# Patient Record
Sex: Female | Born: 1955 | Race: White | Hispanic: No | Marital: Married | Smoking: Never smoker
Health system: Southern US, Community
[De-identification: ages and names within clinical notes are randomized; demographics above are authoritative.]

## PROBLEM LIST (undated history)

## (undated) DIAGNOSIS — M8580 Other specified disorders of bone density and structure, unspecified site: Secondary | ICD-10-CM

## (undated) DIAGNOSIS — R928 Other abnormal and inconclusive findings on diagnostic imaging of breast: Secondary | ICD-10-CM

## (undated) DIAGNOSIS — Z8489 Family history of other specified conditions: Secondary | ICD-10-CM

## (undated) DIAGNOSIS — D259 Leiomyoma of uterus, unspecified: Secondary | ICD-10-CM

## (undated) HISTORY — PX: RETINAL DETACHMENT SURGERY: SHX105

## (undated) HISTORY — DX: Other abnormal and inconclusive findings on diagnostic imaging of breast: R92.8

## (undated) HISTORY — DX: Leiomyoma of uterus, unspecified: D25.9

## (undated) HISTORY — PX: COLONOSCOPY: SHX174

## (undated) HISTORY — DX: Other specified disorders of bone density and structure, unspecified site: M85.80

---

## 2005-01-11 ENCOUNTER — Ambulatory Visit: Payer: Self-pay

## 2006-02-06 ENCOUNTER — Ambulatory Visit: Payer: Self-pay

## 2007-02-26 ENCOUNTER — Ambulatory Visit: Payer: Self-pay

## 2008-02-27 ENCOUNTER — Ambulatory Visit: Payer: Self-pay

## 2008-03-16 ENCOUNTER — Ambulatory Visit: Payer: Self-pay | Admitting: General Surgery

## 2008-09-01 ENCOUNTER — Ambulatory Visit: Payer: Self-pay | Admitting: General Surgery

## 2009-03-01 ENCOUNTER — Ambulatory Visit: Payer: Self-pay

## 2010-03-03 ENCOUNTER — Ambulatory Visit: Payer: Self-pay

## 2011-03-24 ENCOUNTER — Ambulatory Visit: Payer: Self-pay

## 2011-04-04 ENCOUNTER — Ambulatory Visit: Payer: Self-pay

## 2016-05-05 ENCOUNTER — Other Ambulatory Visit: Payer: Self-pay | Admitting: Family Medicine

## 2016-05-05 DIAGNOSIS — Z1231 Encounter for screening mammogram for malignant neoplasm of breast: Secondary | ICD-10-CM

## 2016-05-22 ENCOUNTER — Ambulatory Visit
Admission: RE | Admit: 2016-05-22 | Discharge: 2016-05-22 | Disposition: A | Discharge status: Home / Self Care | Payer: BC Managed Care – PPO | Source: Ambulatory Visit | Attending: Family Medicine | Admitting: Family Medicine

## 2016-05-22 ENCOUNTER — Encounter: Payer: Self-pay | Admitting: Radiology

## 2016-05-22 DIAGNOSIS — Z1231 Encounter for screening mammogram for malignant neoplasm of breast: Secondary | ICD-10-CM | POA: Diagnosis present

## 2017-04-02 ENCOUNTER — Other Ambulatory Visit: Payer: Self-pay | Admitting: Obstetrics and Gynecology

## 2017-04-02 DIAGNOSIS — Z1231 Encounter for screening mammogram for malignant neoplasm of breast: Secondary | ICD-10-CM

## 2017-05-07 ENCOUNTER — Encounter (INDEPENDENT_AMBULATORY_CARE_PROVIDER_SITE_OTHER): Payer: BC Managed Care – PPO | Admitting: Ophthalmology

## 2017-05-07 DIAGNOSIS — H2513 Age-related nuclear cataract, bilateral: Secondary | ICD-10-CM

## 2017-05-07 DIAGNOSIS — H43813 Vitreous degeneration, bilateral: Secondary | ICD-10-CM | POA: Diagnosis not present

## 2017-05-07 DIAGNOSIS — H33303 Unspecified retinal break, bilateral: Secondary | ICD-10-CM

## 2017-05-07 DIAGNOSIS — H35413 Lattice degeneration of retina, bilateral: Secondary | ICD-10-CM

## 2017-05-23 ENCOUNTER — Ambulatory Visit
Admission: RE | Admit: 2017-05-23 | Discharge: 2017-05-23 | Disposition: A | Discharge status: Home / Self Care | Payer: BC Managed Care – PPO | Source: Ambulatory Visit | Attending: Obstetrics and Gynecology | Admitting: Obstetrics and Gynecology

## 2017-05-23 DIAGNOSIS — Z1231 Encounter for screening mammogram for malignant neoplasm of breast: Secondary | ICD-10-CM

## 2017-05-30 ENCOUNTER — Encounter: Payer: Self-pay | Admitting: Obstetrics and Gynecology

## 2017-05-30 ENCOUNTER — Ambulatory Visit (INDEPENDENT_AMBULATORY_CARE_PROVIDER_SITE_OTHER): Payer: BC Managed Care – PPO | Admitting: Obstetrics and Gynecology

## 2017-05-30 VITALS — BP 132/88 | Ht 64.0 in | Wt 149.0 lb

## 2017-05-30 DIAGNOSIS — Z01419 Encounter for gynecological examination (general) (routine) without abnormal findings: Secondary | ICD-10-CM

## 2017-05-30 DIAGNOSIS — Z1331 Encounter for screening for depression: Secondary | ICD-10-CM | POA: Diagnosis not present

## 2017-05-30 DIAGNOSIS — N941 Unspecified dyspareunia: Secondary | ICD-10-CM | POA: Insufficient documentation

## 2017-05-30 DIAGNOSIS — Z1339 Encounter for screening examination for other mental health and behavioral disorders: Secondary | ICD-10-CM

## 2017-05-30 MED ORDER — ESTROGENS, CONJUGATED 0.625 MG/GM VA CREA: 1.0000 | TOPICAL_CREAM | VAGINAL | 3 refills | Status: DC

## 2017-05-30 NOTE — Progress Notes (Signed)
Routine Annual Gynecology Examination   PCP: Patient, No Pcp Per  Chief Complaint  Patient presents with  . Annual Exam   History of Present Illness: Patient is a 61 y.o. G2P0020 presents for annual exam. The patient has no complaints today.   Menopausal bleeding: denies  Menopausal symptoms: denies  Breast symptoms: denies  Last pap smear: 1 years ago.  Result Normal  Last mammogram: 1 week ago.  Result Normal  ([facility] 1)  Stopped using Premarin last year due to husband having having oral surgery.  Wants to re-start.   Past Medical History:  Diagnosis Date  . Abnormal mammogram   . Leiomyoma of uterus, unspecified   . Osteopenia     Past Surgical History:  Procedure Laterality Date  . COLONOSCOPY    . RETINAL DETACHMENT SURGERY      Prior to Admission medications   Medication Sig Start Date End Date Taking? Authorizing Provider  Calcium Carbonate-Vit D-Min (CALCIUM 1200 PO) Take by mouth.   Yes [provider]  cholecalciferol (VITAMIN D) 1000 units tablet Take 1,000 Units by mouth daily.   Yes [provider]   Allergies: No Known Allergies  Obstetric History: [id number], SAB x 2  Social History   Social History  . Marital status: Married    Spouse name: N/A  . Number of children: N/A  . Years of education: N/A   Occupational History  . Not on file.   Social History Main Topics  . Smoking status: Never Smoker  . Smokeless tobacco: Never Used  . Alcohol use No  . Drug use: No  . Sexual activity: Not Currently    Birth control/ protection: Post-menopausal   Other Topics Concern  . Not on file   Social History Narrative  . No narrative on file    Family History  Problem Relation Age of Onset  . Breast cancer Maternal Grandmother   . Hypertension Mother   . Stroke Paternal Grandfather     Review of Systems  Constitutional: Negative.   HENT: Negative.   Eyes: Negative.   Respiratory: Negative.   Cardiovascular:  Negative.   Gastrointestinal: Negative.   Genitourinary: Negative.   Musculoskeletal: Negative.   Skin: Negative.   Neurological: Negative.   Psychiatric/Behavioral: Negative.      Physical Exam Vitals: BP 132/88   Ht 5\' 4"  (1.626 m)   Wt 149 lb (67.6 kg)   BMI 25.58 kg/m   Physical Exam  Constitutional: She is oriented to person, place, and time. She appears well-developed and well-nourished. No distress.  Genitourinary: Vagina normal and uterus normal. Pelvic exam was performed with patient supine. There is no rash, tenderness or lesion on the right labia. There is no rash or lesion on the left labia. No bleeding in the vagina. Right adnexum does not display mass, does not display tenderness and does not display fullness. Left adnexum does not display mass, does not display tenderness and does not display fullness. Cervix does not exhibit motion tenderness, lesion or polyp.   Uterus is mobile. Uterus is not enlarged, tender or exhibiting a mass.  HENT:  Head: Normocephalic and atraumatic.  Eyes: EOM are normal. No scleral icterus.  Neck: Normal range of motion. Neck supple. No thyromegaly present.  Cardiovascular: Normal rate and regular rhythm.  Exam reveals no gallop and no friction rub.   No murmur heard. Pulmonary/Chest: Effort normal and breath sounds normal. No respiratory distress. She has no wheezes. She has no rales.  Abdominal: Soft.  Bowel sounds are normal. She exhibits no distension and no mass. There is no tenderness. There is no rebound and no guarding.  Musculoskeletal: Normal range of motion. She exhibits no edema.  Lymphadenopathy:    She has no cervical adenopathy.  Neurological: She is alert and oriented to person, place, and time. No cranial nerve deficit.  Skin: Skin is warm and dry. No erythema.  Psychiatric: She has a normal mood and affect. Her behavior is normal. Judgment normal.    Female chaperone present for pelvic and breast  portions of the physical  exam  Results: AUDIT Questionnaire (screen for alcoholism): 1 PHQ-9: 1   Assessment and Plan:  61 y.o. G35P0020 female here for routine annual gynecologic examination  Plan: Problem List Items Addressed This Visit    Dyspareunia, female   Relevant Medications   conjugated estrogens (PREMARIN) vaginal cream (Start on 05/31/2017)    Other Visit Diagnoses    Women's annual routine gynecological examination    -  Primary   Screening for depression       Screening for alcohol problem          Screening: -- Blood pressure screen normal -- Colonoscopy - due - managed by PCP -- Mammogram - not due (just completed 1 week ago) -- Weight screening: normal -- Depression screening negative (PHQ-9) -- Nutrition: normal -- cholesterol screening: per PCP -- osteoporosis screening: not due -- tobacco screening: not using -- alcohol screening: AUDIT questionnaire indicates low-risk usage. -- family history of breast cancer screening: done. not at high risk. -- no evidence of domestic violence or intimate partner violence. -- STD screening: gonorrhea/chlamydia NAAT not collected per patient request. -- pap smear not collected per ASCCP guidelines -- HPV vaccination series: not eligilbe -- Other vaccines: shingles - per PCP  Prentice Docker, MD 05/30/2017 12:40 PM

## 2017-05-31 ENCOUNTER — Encounter (INDEPENDENT_AMBULATORY_CARE_PROVIDER_SITE_OTHER): Payer: BC Managed Care – PPO | Admitting: Ophthalmology

## 2017-05-31 DIAGNOSIS — H33302 Unspecified retinal break, left eye: Secondary | ICD-10-CM

## 2017-06-18 ENCOUNTER — Encounter (INDEPENDENT_AMBULATORY_CARE_PROVIDER_SITE_OTHER): Payer: BC Managed Care – PPO | Admitting: Ophthalmology

## 2017-06-18 DIAGNOSIS — H33303 Unspecified retinal break, bilateral: Secondary | ICD-10-CM

## 2017-10-17 ENCOUNTER — Encounter (INDEPENDENT_AMBULATORY_CARE_PROVIDER_SITE_OTHER): Payer: BC Managed Care – PPO | Admitting: Ophthalmology

## 2017-10-17 DIAGNOSIS — H43813 Vitreous degeneration, bilateral: Secondary | ICD-10-CM

## 2017-10-17 DIAGNOSIS — H33303 Unspecified retinal break, bilateral: Secondary | ICD-10-CM

## 2017-10-17 DIAGNOSIS — H2513 Age-related nuclear cataract, bilateral: Secondary | ICD-10-CM | POA: Diagnosis not present

## 2018-04-19 ENCOUNTER — Other Ambulatory Visit: Payer: Self-pay | Admitting: Obstetrics and Gynecology

## 2018-04-19 DIAGNOSIS — Z1231 Encounter for screening mammogram for malignant neoplasm of breast: Secondary | ICD-10-CM

## 2018-05-27 ENCOUNTER — Ambulatory Visit
Admission: RE | Admit: 2018-05-27 | Discharge: 2018-05-27 | Disposition: A | Discharge status: Home / Self Care | Payer: BC Managed Care – PPO | Source: Ambulatory Visit | Attending: Obstetrics and Gynecology | Admitting: Obstetrics and Gynecology

## 2018-05-27 DIAGNOSIS — Z1231 Encounter for screening mammogram for malignant neoplasm of breast: Secondary | ICD-10-CM | POA: Diagnosis not present

## 2018-06-03 ENCOUNTER — Other Ambulatory Visit (HOSPITAL_COMMUNITY)
Admission: RE | Admit: 2018-06-03 | Discharge: 2018-06-03 | Disposition: A | Discharge status: Home / Self Care | Payer: BC Managed Care – PPO | Source: Ambulatory Visit | Attending: Obstetrics and Gynecology | Admitting: Obstetrics and Gynecology

## 2018-06-03 ENCOUNTER — Encounter: Payer: Self-pay | Admitting: Obstetrics and Gynecology

## 2018-06-03 ENCOUNTER — Ambulatory Visit (INDEPENDENT_AMBULATORY_CARE_PROVIDER_SITE_OTHER): Payer: BC Managed Care – PPO | Admitting: Obstetrics and Gynecology

## 2018-06-03 VITALS — BP 118/74 | Ht 64.0 in | Wt 145.0 lb

## 2018-06-03 DIAGNOSIS — Z01419 Encounter for gynecological examination (general) (routine) without abnormal findings: Secondary | ICD-10-CM | POA: Diagnosis not present

## 2018-06-03 DIAGNOSIS — Z1211 Encounter for screening for malignant neoplasm of colon: Secondary | ICD-10-CM | POA: Diagnosis not present

## 2018-06-03 DIAGNOSIS — Z124 Encounter for screening for malignant neoplasm of cervix: Secondary | ICD-10-CM

## 2018-06-03 DIAGNOSIS — Z1339 Encounter for screening examination for other mental health and behavioral disorders: Secondary | ICD-10-CM | POA: Diagnosis not present

## 2018-06-03 DIAGNOSIS — Z1331 Encounter for screening for depression: Secondary | ICD-10-CM | POA: Diagnosis not present

## 2018-06-03 NOTE — Progress Notes (Signed)
Routine Annual Gynecology Examination   PCP: Lawerance Cruel, MD  Chief Complaint  Patient presents with  . Annual Exam    History of Present Illness: Patient is a 62 y.o. G2P0020 presents for annual exam. The patient has no complaints today.   Menopausal bleeding: denies  Menopausal symptoms: mild hot flashes  Breast symptoms: denies  Last pap smear: 2 years ago.  Result Normal  Last mammogram: this month.  Result Normal  Last Colonoscopy: 10 years ago.  Was normal.   Past Medical History:  Diagnosis Date  . Abnormal mammogram   . Leiomyoma of uterus, unspecified   . Osteopenia     Past Surgical History:  Procedure Laterality Date  . COLONOSCOPY    . RETINAL DETACHMENT SURGERY      Prior to Admission medications   Medication Sig Start Date End Date Taking? Authorizing Provider  Calcium Carbonate-Vit D-Min (CALCIUM 1200 PO) Take by mouth.   Yes [provider]  cholecalciferol (VITAMIN D) 1000 units tablet Take 1,000 Units by mouth daily.   Yes [provider]  conjugated estrogens (PREMARIN) vaginal cream Place 1 Applicatorful vaginally 2 (two) times a week. 0.5 gram vaginally at bedtime twice weekly Patient not taking: Reported on 06/03/2018 05/31/17   Will Bonnet, MD   Allergies: No Known Allergies  Obstetric History: G2P0020  Social History   Socioeconomic History  . Marital status: Married    Spouse name: Not on file  . Number of children: Not on file  . Years of education: Not on file  . Highest education level: Not on file  Occupational History  . Not on file  Social Needs  . Financial resource strain: Not on file  . Food insecurity:    Worry: Not on file    Inability: Not on file  . Transportation needs:    Medical: Not on file    Non-medical: Not on file  Tobacco Use  . Smoking status: Never Smoker  . Smokeless tobacco: Never Used  Substance and Sexual Activity  . Alcohol use: No  . Drug use: No  . Sexual  activity: Not Currently    Birth control/protection: Post-menopausal  Lifestyle  . Physical activity:    Days per week: Not on file    Minutes per session: Not on file  . Stress: Not on file  Relationships  . Social connections:    Talks on phone: Not on file    Gets together: Not on file    Attends religious service: Not on file    Active member of club or organization: Not on file    Attends meetings of clubs or organizations: Not on file    Relationship status: Not on file  . Intimate partner violence:    Fear of current or ex partner: Not on file    Emotionally abused: Not on file    Physically abused: Not on file    Forced sexual activity: Not on file  Other Topics Concern  . Not on file  Social History Narrative  . Not on file    Family History  Problem Relation Age of Onset  . Breast cancer Maternal Grandmother   . Hypertension Mother   . Stroke Paternal Grandfather     Review of Systems  Constitutional: Negative.   HENT: Negative.   Eyes: Negative.   Respiratory: Negative.   Cardiovascular: Negative.   Gastrointestinal: Negative.   Genitourinary: Negative.   Musculoskeletal: Negative.   Skin: Negative.  Neurological: Negative.   Psychiatric/Behavioral: Negative.      Physical Exam Vitals: BP 118/74   Ht 5\' 4"  (1.626 m)   Wt 145 lb (65.8 kg)   BMI 24.89 kg/m   Physical Exam  Constitutional: She is oriented to person, place, and time. She appears well-developed and well-nourished. No distress.  Genitourinary: Uterus normal. Pelvic exam was performed with patient supine. There is no rash, tenderness, lesion or injury on the right labia. There is no rash, tenderness, lesion or injury on the left labia. No erythema, tenderness or bleeding in the vagina. No signs of injury around the vagina. No vaginal discharge found. Right adnexum does not display mass, does not display tenderness and does not display fullness. Left adnexum does not display mass, does not  display tenderness and does not display fullness. Cervix does not exhibit motion tenderness, lesion, discharge or polyp.   Uterus is mobile and anteverted. Uterus is not enlarged, tender or exhibiting a mass.  HENT:  Head: Normocephalic and atraumatic.  Eyes: EOM are normal. No scleral icterus.  Neck: Normal range of motion. Neck supple. No thyromegaly present.  Cardiovascular: Normal rate and regular rhythm. Exam reveals no gallop and no friction rub.  No murmur heard. Pulmonary/Chest: Effort normal and breath sounds normal. No respiratory distress. She has no wheezes. She has no rales. Right breast exhibits no inverted nipple, no mass, no nipple discharge, no skin change and no tenderness. Left breast exhibits no inverted nipple, no mass, no nipple discharge, no skin change and no tenderness.  Abdominal: Soft. Bowel sounds are normal. She exhibits no distension and no mass. There is no tenderness. There is no rebound and no guarding.  Musculoskeletal: Normal range of motion. She exhibits no edema or tenderness.  Lymphadenopathy:    She has no cervical adenopathy.       Right: No inguinal adenopathy present.       Left: No inguinal adenopathy present.  Neurological: She is alert and oriented to person, place, and time. No cranial nerve deficit.  Skin: Skin is warm and dry. No rash noted. No erythema.  Psychiatric: She has a normal mood and affect. Her behavior is normal. Judgment normal.     Female chaperone present for pelvic and breast  portions of the physical exam  Results: AUDIT Questionnaire (screen for alcoholism): 1 PHQ-9: 1   Assessment and Plan:  62 y.o. G64P0020 female here for routine annual gynecologic examination  Plan: Problem List Items Addressed This Visit    None    Visit Diagnoses    Women's annual routine gynecological examination    -  Primary   Relevant Orders   Cytology - PAP   Screening for depression       Screening for alcoholism       Pap smear for  cervical cancer screening       Relevant Orders   Cytology - PAP      Screening: -- Blood pressure screen normal -- Colonoscopy - due - will schedule -- Mammogram - not due -- Weight screening: normal -- Depression screening negative (PHQ-9) -- Nutrition: normal -- cholesterol screening: per PCP -- osteoporosis screening: not due -- tobacco screening: not using -- alcohol screening: AUDIT questionnaire indicates low-risk usage. -- family history of breast cancer screening: done. not at high risk. -- no evidence of domestic violence or intimate partner violence. -- STD screening: gonorrhea/chlamydia NAAT not collected per patient request. -- pap smear collected per ASCCP guidelines -- HPV vaccination series:  not Lanetta Inch, MD 06/03/2018 10:36 AM

## 2018-06-05 ENCOUNTER — Encounter: Payer: Self-pay | Admitting: Obstetrics and Gynecology

## 2018-06-05 LAB — CYTOLOGY - PAP
Diagnosis: NEGATIVE
HPV: NOT DETECTED

## 2018-06-27 ENCOUNTER — Ambulatory Visit: Payer: BC Managed Care – PPO | Admitting: General Surgery

## 2018-06-27 ENCOUNTER — Other Ambulatory Visit: Payer: Self-pay

## 2018-06-27 ENCOUNTER — Encounter: Payer: Self-pay | Admitting: General Surgery

## 2018-06-27 VITALS — BP 122/72 | HR 70 | Temp 97.7°F | Resp 12 | Ht 65.0 in | Wt 146.0 lb

## 2018-06-27 DIAGNOSIS — Z1211 Encounter for screening for malignant neoplasm of colon: Secondary | ICD-10-CM

## 2018-06-27 MED ORDER — POLYETHYLENE GLYCOL 3350 17 GM/SCOOP PO POWD: ORAL | 0 refills | Status: DC

## 2018-06-27 NOTE — Patient Instructions (Signed)
Colonoscopy, Adult A colonoscopy is an exam to look at the entire large intestine. During the exam, a lubricated, bendable tube is inserted into the anus and then passed into the rectum, colon, and other parts of the large intestine. A colonoscopy is often done as a part of normal colorectal screening or in response to certain symptoms, such as anemia, persistent diarrhea, abdominal pain, and blood in the stool. The exam can help screen for and diagnose medical problems, including:  Tumors.  Polyps.  Inflammation.  Areas of bleeding.  Tell a health care provider about:  Any allergies you have.  All medicines you are taking, including vitamins, herbs, eye drops, creams, and over-the-counter medicines.  Any problems you or family members have had with anesthetic medicines.  Any blood disorders you have.  Any surgeries you have had.  Any medical conditions you have.  Any problems you have had passing stool. What are the risks? Generally, this is a safe procedure. However, problems may occur, including:  Bleeding.  A tear in the intestine.  A reaction to medicines given during the exam.  Infection (rare).  What happens before the procedure? Eating and drinking restrictions Follow instructions from your health care provider about eating and drinking, which may include:  A few days before the procedure - follow a low-fiber diet. Avoid nuts, seeds, dried fruit, raw fruits, and vegetables.  1-3 days before the procedure - follow a clear liquid diet. Drink only clear liquids, such as clear broth or bouillon, black coffee or tea, clear juice, clear soft drinks or sports drinks, gelatin dessert, and popsicles. Avoid any liquids that contain red or purple dye.  On the day of the procedure - do not eat or drink anything during the 2 hours before the procedure, or within the time period that your health care provider recommends.  Bowel prep If you were prescribed an oral bowel prep  to clean out your colon:  Take it as told by your health care provider. Starting the day before your procedure, you will need to drink a large amount of medicated liquid. The liquid will cause you to have multiple loose stools until your stool is almost clear or light green.  If your skin or anus gets irritated from diarrhea, you may use these to relieve the irritation: ? Medicated wipes, such as adult wet wipes with aloe and vitamin E. ? A skin soothing-product like petroleum jelly.  If you vomit while drinking the bowel prep, take a break for up to 60 minutes and then begin the bowel prep again. If vomiting continues and you cannot take the bowel prep without vomiting, call your health care provider.  General instructions  Ask your health care provider about changing or stopping your regular medicines. This is especially important if you are taking diabetes medicines or blood thinners.  Plan to have someone take you home from the hospital or clinic. What happens during the procedure?  An IV tube may be inserted into one of your veins.  You will be given medicine to help you relax (sedative).  To reduce your risk of infection: ? Your health care team will wash or sanitize their hands. ? Your anal area will be washed with soap.  You will be asked to lie on your side with your knees bent.  Your health care provider will lubricate a long, thin, flexible tube. The tube will have a camera and a light on the end.  The tube will be inserted into your   anus.  The tube will be gently eased through your rectum and colon.  Air will be delivered into your colon to keep it open. You may feel some pressure or cramping.  The camera will be used to take images during the procedure.  A small tissue sample may be removed from your body to be examined under a microscope (biopsy). If any potential problems are found, the tissue will be sent to a lab for testing.  If small polyps are found, your  health care provider may remove them and have them checked for cancer cells.  The tube that was inserted into your anus will be slowly removed. The procedure may vary among health care providers and hospitals. What happens after the procedure?  Your blood pressure, heart rate, breathing rate, and blood oxygen level will be monitored until the medicines you were given have worn off.  Do not drive for 24 hours after the exam.  You may have a small amount of blood in your stool.  You may pass gas and have mild abdominal cramping or bloating due to the air that was used to inflate your colon during the exam.  It is up to you to get the results of your procedure. Ask your health care provider, or the department performing the procedure, when your results will be ready. This information is not intended to replace advice given to you by your health care provider. Make sure you discuss any questions you have with your health care provider. Document Released: 07/28/2000 Document Revised: 05/31/2016 Document Reviewed: 10/12/2015 Elsevier Interactive Patient Education  2018 [employer]  

## 2018-06-27 NOTE — Progress Notes (Signed)
Patient ID: Amber Mendoza, female   DOB: 1956-06-14, 62 y.o.   MRN: [id number]  Chief Complaint  Patient presents with  . Colonoscopy    HPI Amber Mendoza is a 62 y.o. female Here today for evaluation of a screening colonoscopy. Denies any gastrointestinal issues at this time.  Bowels move regular and no bleeding.Last colonoscopy 2010.  HPI  Past Medical History:  Diagnosis Date  . Abnormal mammogram   . Leiomyoma of uterus, unspecified   . Osteopenia     Past Surgical History:  Procedure Laterality Date  . COLONOSCOPY    . RETINAL DETACHMENT SURGERY Bilateral 2015,2017    Family History  Problem Relation Age of Onset  . Breast cancer Maternal Grandmother   . Hypertension Mother   . Stroke Paternal Grandfather     Social History Social History   Tobacco Use  . Smoking status: Never Smoker  . Smokeless tobacco: Never Used  Substance Use Topics  . Alcohol use: No  . Drug use: No    No Known Allergies  Current Outpatient Medications  Medication Sig Dispense Refill  . Calcium Carbonate-Vit D-Min (CALCIUM 1200 PO) Take by mouth.    . cholecalciferol (VITAMIN D) 1000 units tablet Take 1,000 Units by mouth daily.    . polyethylene glycol powder (GLYCOLAX/MIRALAX) powder 255 grams one bottle for colonoscopy prep 255 g 0   No current facility-administered medications for this visit.     Review of Systems Review of Systems  Constitutional: Negative.   Respiratory: Negative.   Cardiovascular: Negative.     Blood pressure 122/72, pulse 70, temperature 97.7 F (36.5 C), temperature source Skin, resp. rate 12, height 5\' 5"  (1.651 m), weight 146 lb (66.2 kg).  Physical Exam Physical Exam  Constitutional: She is oriented to person, place, and time. She appears well-developed and well-nourished.  Eyes: Conjunctivae are normal. No scleral icterus.  Neck: Normal range of motion.  Cardiovascular: Normal rate, regular rhythm and normal heart sounds.  Pulmonary/Chest:  Effort normal and breath sounds normal.  Lymphadenopathy:    She has no cervical adenopathy.  Neurological: She is alert and oriented to person, place, and time.  Skin: Skin is warm and dry.    Data Reviewed September 01, 2008 screening colonoscopy was reviewed.  Normal exam.  Assessment    Candidate for screening colonoscopy.    Plan   Colonoscopy with possible biopsy/polypectomy prn: Information regarding the procedure, including its potential risks and complications (including but not limited to perforation of the bowel, which may require emergency surgery to repair, and bleeding) was verbally given to the patient. Educational information regarding lower intestinal endoscopy was given to the patient. Written instructions for how to complete the bowel prep using Miralax were provided. The importance of drinking ample fluids to avoid dehydration as a result of the prep emphasized.   HPI, Physical Exam, Assessment and Plan have been scribed under the direction and in the presence of Amber Ard, MD.  Amber Mendoza, CMA  I have completed the exam and reviewed the above documentation for accuracy and completeness.  I agree with the above.  Haematologist has been used and any errors in dictation or transcription are unintentional.  Amber Mendoza, M.D., F.A.C.S.  Amber Mendoza 06/27/2018, 3:16 PM  Patient has been scheduled for a colonoscopy on 07-17-18 at [facility]. Miralax prescription has been sent in to the patient's pharmacy today. Colonoscopy instructions have been reviewed with the patient. This patient is aware to call the office if they  have further questions.   Dominga Ferry, CMA

## 2018-07-17 ENCOUNTER — Ambulatory Visit
Admission: RE | Admit: 2018-07-17 | Discharge: 2018-07-17 | Disposition: A | Discharge status: Home / Self Care | Payer: BC Managed Care – PPO | Source: Ambulatory Visit | Attending: General Surgery | Admitting: General Surgery

## 2018-07-17 ENCOUNTER — Ambulatory Visit: Payer: BC Managed Care – PPO | Admitting: Anesthesiology

## 2018-07-17 ENCOUNTER — Encounter
Admission: RE | Disposition: A | Discharge status: Home / Self Care | Payer: Self-pay | Source: Ambulatory Visit | Attending: General Surgery

## 2018-07-17 ENCOUNTER — Other Ambulatory Visit: Payer: Self-pay

## 2018-07-17 DIAGNOSIS — Z79899 Other long term (current) drug therapy: Secondary | ICD-10-CM | POA: Diagnosis not present

## 2018-07-17 DIAGNOSIS — Z1211 Encounter for screening for malignant neoplasm of colon: Secondary | ICD-10-CM | POA: Insufficient documentation

## 2018-07-17 HISTORY — PX: COLONOSCOPY WITH PROPOFOL: SHX5780

## 2018-07-17 HISTORY — DX: Family history of other specified conditions: Z84.89

## 2018-07-17 SURGERY — COLONOSCOPY WITH PROPOFOL
Anesthesia: General

## 2018-07-17 MED ORDER — PROPOFOL 500 MG/50ML IV EMUL
INTRAVENOUS | Status: AC
  Filled 2018-07-17: qty 50

## 2018-07-17 MED ORDER — FENTANYL CITRATE (PF) 100 MCG/2ML IJ SOLN
INTRAMUSCULAR | Status: DC | PRN
  Administered 2018-07-17 (×2): 50 ug via INTRAVENOUS

## 2018-07-17 MED ORDER — PROPOFOL 500 MG/50ML IV EMUL
INTRAVENOUS | Status: DC | PRN
  Administered 2018-07-17: 160 ug/kg/min via INTRAVENOUS

## 2018-07-17 MED ORDER — PROPOFOL 10 MG/ML IV BOLUS
INTRAVENOUS | Status: DC | PRN
  Administered 2018-07-17: 100 mg via INTRAVENOUS

## 2018-07-17 MED ORDER — FENTANYL CITRATE (PF) 100 MCG/2ML IJ SOLN
INTRAMUSCULAR | Status: AC
  Filled 2018-07-17: qty 2

## 2018-07-17 MED ORDER — SODIUM CHLORIDE 0.9 % IV SOLN
INTRAVENOUS | Status: DC
  Administered 2018-07-17: 10:00:00 via INTRAVENOUS

## 2018-07-17 MED ORDER — PHENYLEPHRINE HCL 10 MG/ML IJ SOLN
INTRAMUSCULAR | Status: DC | PRN
  Administered 2018-07-17 (×2): 100 ug via INTRAVENOUS

## 2018-07-17 MED ORDER — LIDOCAINE 2% (20 MG/ML) 5 ML SYRINGE
INTRAMUSCULAR | Status: DC | PRN
  Administered 2018-07-17: 30 mg via INTRAVENOUS

## 2018-07-17 MED ORDER — PROPOFOL 10 MG/ML IV BOLUS
INTRAVENOUS | Status: AC
  Filled 2018-07-17: qty 20

## 2018-07-17 NOTE — Anesthesia Post-op Follow-up Note (Signed)
Anesthesia QCDR form completed.        

## 2018-07-17 NOTE — Anesthesia Preprocedure Evaluation (Addendum)
Anesthesia Evaluation  Patient identified by MRN, date of birth, ID band Patient awake    Reviewed: Allergy & Precautions, H&P , NPO status , reviewed documented beta blocker date and time   History of Anesthesia Complications (+) Family history of anesthesia reaction  Airway Mallampati: II  TM Distance: >3 FB Neck ROM: full    Dental  (+) Teeth Intact   Pulmonary    Pulmonary exam normal        Cardiovascular Normal cardiovascular exam     Neuro/Psych    GI/Hepatic   Endo/Other    Renal/GU      Musculoskeletal   Abdominal   Peds  Hematology   Anesthesia Other Findings Past Medical History: No date: Abnormal mammogram No date: Leiomyoma of uterus, unspecified No date: Osteopenia  Past Surgical History: No date: COLONOSCOPY 2015,2017: RETINAL DETACHMENT SURGERY; Bilateral     Reproductive/Obstetrics                            Anesthesia Physical Anesthesia Plan  ASA: II  Anesthesia Plan: General   Post-op Pain Management:    Induction: Intravenous  PONV Risk Score and Plan: Treatment may vary due to age or medical condition and TIVA  Airway Management Planned: Nasal Cannula and Natural Airway  Additional Equipment:   Intra-op Plan:   Post-operative Plan:   Informed Consent: I have reviewed the patients History and Physical, chart, labs and discussed the procedure including the risks, benefits and alternatives for the proposed anesthesia with the patient or authorized representative who has indicated his/her understanding and acceptance.   Dental Advisory Given  Plan Discussed with: CRNA  Anesthesia Plan Comments:        Anesthesia Quick Evaluation

## 2018-07-17 NOTE — H&P (Signed)
Amber Mendoza [id number] 09-09-1955     HPI: Healthy 62 y.o woman for screening colonoscopy. Tolerated prep well.   Medications Prior to Admission  Medication Sig Dispense Refill Last Dose  . Glucosamine-Chondroitin (GLUCOSAMINE CHONDR COMPLEX PO) Take 1 tablet by mouth 2 (two) times daily.   07/14/2018  . Multiple Vitamins-Minerals (CENTRUM SILVER 50+WOMEN PO) Take 1 tablet by mouth daily.     . polyethylene glycol powder (GLYCOLAX/MIRALAX) powder 255 grams one bottle for colonoscopy prep 255 g 0   . Calcium Carbonate-Vit D-Min (CALCIUM 1200 PO) Take by mouth.   Taking  . cholecalciferol (VITAMIN D) 1000 units tablet Take 1,000 Units by mouth daily.   07/14/2018   No Known Allergies Past Medical History:  Diagnosis Date  . Abnormal mammogram   . Family history of adverse reaction to anesthesia    father very sensitive to all medications, give him half doses of meds.  . Leiomyoma of uterus, unspecified   . Osteopenia    Past Surgical History:  Procedure Laterality Date  . COLONOSCOPY    . RETINAL DETACHMENT SURGERY Bilateral 2015,2017   Social History   Socioeconomic History  . Marital status: Married    Spouse name: Not on file  . Number of children: Not on file  . Years of education: Not on file  . Highest education level: Not on file  Occupational History  . Not on file  Social Needs  . Financial resource strain: Not on file  . Food insecurity:    Worry: Not on file    Inability: Not on file  . Transportation needs:    Medical: Not on file    Non-medical: Not on file  Tobacco Use  . Smoking status: Never Smoker  . Smokeless tobacco: Never Used  Substance and Sexual Activity  . Alcohol use: No  . Drug use: No  . Sexual activity: Not Currently    Birth control/protection: Post-menopausal  Lifestyle  . Physical activity:    Days per week: Not on file    Minutes per session: Not on file  . Stress: Not on file  Relationships  . Social connections:    Talks  on phone: Not on file    Gets together: Not on file    Attends religious service: Not on file    Active member of club or organization: Not on file    Attends meetings of clubs or organizations: Not on file    Relationship status: Not on file  . Intimate partner violence:    Fear of current or ex partner: Not on file    Emotionally abused: Not on file    Physically abused: Not on file    Forced sexual activity: Not on file  Other Topics Concern  . Not on file  Social History Narrative  . Not on file   Social History   Social History Narrative  . Not on file     ROS: Negative.     PE: HEENT: Negative. Lungs: Clear. Cardio: RR.Marland Kitchen Assessment/Plan:  Proceed with planned endoscopy. Forest Gleason Byrnett 07/17/2018

## 2018-07-17 NOTE — Transfer of Care (Signed)
Immediate Anesthesia Transfer of Care Note  Patient: Amber Mendoza  Procedure(s) Performed: COLONOSCOPY WITH PROPOFOL (N/A )  Patient Location: PACU and Endoscopy Unit  Anesthesia Type:General  Level of Consciousness: sedated  Airway & Oxygen Therapy: Patient Spontanous Breathing and Patient connected to nasal cannula oxygen  Post-op Assessment: Report given to RN and Post -op Vital signs reviewed and stable  Post vital signs: Reviewed and stable  Last Vitals:  Vitals Value Taken Time  BP 98/58 07/17/2018 10:08 AM  Temp 36 C 07/17/2018 10:10 AM  Pulse 63 07/17/2018 10:10 AM  Resp 17 07/17/2018 10:10 AM  SpO2 100 % 07/17/2018 10:10 AM  Vitals shown include unvalidated device data.  Last Pain:  Vitals:   07/17/18 1008  TempSrc: Tympanic  PainSc: 0-No pain         Complications: No apparent anesthesia complications

## 2018-07-17 NOTE — Anesthesia Postprocedure Evaluation (Signed)
Anesthesia Post Note  Patient: Amber Mendoza  Procedure(s) Performed: COLONOSCOPY WITH PROPOFOL (N/A )  Patient location during evaluation: Endoscopy Anesthesia Type: General Level of consciousness: awake and alert Pain management: pain level controlled Vital Signs Assessment: post-procedure vital signs reviewed and stable Respiratory status: spontaneous breathing, nonlabored ventilation and respiratory function stable Cardiovascular status: blood pressure returned to baseline and stable Postop Assessment: no apparent nausea or vomiting Anesthetic complications: no     Last Vitals:  Vitals:   07/17/18 1018 07/17/18 1038  BP: (!) 93/48   Pulse: (!) 59 (!) 56  Resp: 16 (!) 21  Temp:    SpO2: 100% 100%    Last Pain:  Vitals:   07/17/18 1038  TempSrc:   PainSc: 0-No pain                 Alphonsus Sias

## 2018-07-18 ENCOUNTER — Encounter: Payer: Self-pay | Admitting: General Surgery

## 2018-07-18 NOTE — Op Note (Signed)
[facility] Gastroenterology Patient Name: Amber Mendoza Procedure Date: 07/17/2018 9:25 AM MRN: [id number] Account #: [id number] Date of Birth: 1956-06-18 Admit Type: Outpatient Age: 62 Room: [facility] ENDO ROOM 1 Gender: Female Note Status: Finalized Procedure:            Colonoscopy Indications:          Screening for colorectal malignant neoplasm Providers:            Robert Bellow, MD Referring MD:         Lona Kettle (Referring MD) Medicines:            Monitored Anesthesia Care Complications:        No immediate complications. Procedure:            Pre-Anesthesia Assessment:                       - Prior to the procedure, a History and Physical was                        performed, and patient medications, allergies and                        sensitivities were reviewed. The patient's tolerance of                        previous anesthesia was reviewed.                       - The risks and benefits of the procedure and the                        sedation options and risks were discussed with the                        patient. All questions were answered and informed                        consent was obtained.                       After obtaining informed consent, the colonoscope was                        passed under direct vision. Throughout the procedure,                        the patient's blood pressure, pulse, and oxygen                        saturations were monitored continuously. The                        Colonoscope was introduced through the anus and                        advanced to the the terminal ileum. The colonoscopy was                        performed without difficulty. The patient tolerated the  procedure well. The quality of the bowel preparation                        was excellent. Findings:      The entire examined colon appeared normal on direct and retroflexion       views. Impression:           -  The entire examined colon is normal on direct and                        retroflexion views.                       - No specimens collected. Recommendation:       - Repeat colonoscopy in 10 years for screening purposes. Procedure Code(s):    --- Professional ---                       [id number], Colonoscopy, flexible; diagnostic, including                        collection of specimen(s) by brushing or washing, when                        performed (separate procedure) Diagnosis Code(s):    --- Professional ---                       Z12.11, Encounter for screening for malignant neoplasm                        of colon CPT copyright 2018 [employer]. All rights reserved. The codes documented in this report are preliminary and upon coder review may  be revised to meet current compliance requirements. Robert Bellow, MD 07/17/2018 10:04:29 AM This report has been signed electronically. Number of Addenda: 0 Note Initiated On: 07/17/2018 9:25 AM Scope Withdrawal Time: 0 hours 9 minutes 0 seconds  Total Procedure Duration: 0 hours 13 minutes 1 second       [facility]

## 2019-04-24 ENCOUNTER — Other Ambulatory Visit: Payer: Self-pay | Admitting: Obstetrics and Gynecology

## 2019-04-24 DIAGNOSIS — Z1231 Encounter for screening mammogram for malignant neoplasm of breast: Secondary | ICD-10-CM

## 2019-05-28 ENCOUNTER — Other Ambulatory Visit: Payer: Self-pay | Admitting: Podiatry

## 2019-05-28 ENCOUNTER — Encounter: Payer: Self-pay | Admitting: Podiatry

## 2019-05-28 ENCOUNTER — Ambulatory Visit: Payer: BC Managed Care – PPO | Admitting: Podiatry

## 2019-05-28 ENCOUNTER — Ambulatory Visit (INDEPENDENT_AMBULATORY_CARE_PROVIDER_SITE_OTHER): Payer: BC Managed Care – PPO

## 2019-05-28 ENCOUNTER — Ambulatory Visit: Payer: BC Managed Care – PPO

## 2019-05-28 ENCOUNTER — Other Ambulatory Visit: Payer: Self-pay

## 2019-05-28 VITALS — BP 114/85 | HR 79 | Resp 16

## 2019-05-28 DIAGNOSIS — M722 Plantar fascial fibromatosis: Secondary | ICD-10-CM

## 2019-05-28 DIAGNOSIS — M205X9 Other deformities of toe(s) (acquired), unspecified foot: Secondary | ICD-10-CM | POA: Diagnosis not present

## 2019-05-28 DIAGNOSIS — S9032XA Contusion of left foot, initial encounter: Secondary | ICD-10-CM

## 2019-05-28 DIAGNOSIS — M79672 Pain in left foot: Secondary | ICD-10-CM

## 2019-05-28 NOTE — Progress Notes (Signed)
   Subjective:    Patient ID: Amber Mendoza, female    DOB: 07/21/1956, 63 y.o.   MRN: :[id number]  HPI    Review of Systems  All other systems reviewed and are negative.      Objective:   Physical Exam        Assessment & Plan:

## 2019-05-28 NOTE — Patient Instructions (Signed)

## 2019-05-28 NOTE — Progress Notes (Signed)
Subjective:   Patient ID: Amber Mendoza, female   DOB: 63 y.o.   MRN: [id number]   HPI Patient presents stating I bent my left toes several months ago and there is been a spread of the toes since then I have a lot of pain in my right heel and right big toe joints bother me at different times.  Patient states that the heel has been going on for at least 6 months.  Patient does not smoke likes to be active   Review of Systems  All other systems reviewed and are negative.       Objective:  Physical Exam Vitals signs and nursing note reviewed.  Constitutional:      Appearance: She is well-developed.  Pulmonary:     Effort: Pulmonary effort is normal.  Musculoskeletal: Normal range of motion.  Skin:    General: Skin is warm.  Neurological:     Mental Status: She is alert.     Neurovascular status found to be intact muscle strength found to be adequate range of motion within normal limits with patient noted to have exquisite discomfort plantar aspect right heel at the insertional point tendon into the calcaneus with inflammation fluid buildup and separation second and third digits left foot that is causing mild discomfort in the MPJ.  Also has mild discomfort first MPJ bilateral and what appears to be hypermobile first metatarsals     Assessment:  Acute plantar fasciitis right with probable flexor plate stretch left and functional hallux limitus bilateral     Plan:  H&P all conditions reviewed today I did sterile prep injected the fascia right 3 mg Kenalog 5 none Xylocaine applied fascial brace discussed long-term orthotics and applied metatarsal pad to take pressure off the left forefoot.  Reappoint for [place] to recheck again in the next several weeks  X-rays indicated a large plantar spur but did not indicate stress fracture and indicate stress on the big toe joint but no indications for formation

## 2019-05-29 ENCOUNTER — Ambulatory Visit
Admission: RE | Admit: 2019-05-29 | Discharge: 2019-05-29 | Disposition: A | Discharge status: Home / Self Care | Payer: BC Managed Care – PPO | Source: Ambulatory Visit | Attending: Obstetrics and Gynecology | Admitting: Obstetrics and Gynecology

## 2019-05-29 DIAGNOSIS — Z1231 Encounter for screening mammogram for malignant neoplasm of breast: Secondary | ICD-10-CM | POA: Diagnosis not present

## 2019-06-06 ENCOUNTER — Ambulatory Visit (INDEPENDENT_AMBULATORY_CARE_PROVIDER_SITE_OTHER): Payer: BC Managed Care – PPO | Admitting: Obstetrics and Gynecology

## 2019-06-06 ENCOUNTER — Encounter: Payer: Self-pay | Admitting: Obstetrics and Gynecology

## 2019-06-06 ENCOUNTER — Other Ambulatory Visit: Payer: Self-pay

## 2019-06-06 VITALS — BP 124/78 | Ht 64.0 in | Wt 149.0 lb

## 2019-06-06 DIAGNOSIS — Z01419 Encounter for gynecological examination (general) (routine) without abnormal findings: Secondary | ICD-10-CM | POA: Diagnosis not present

## 2019-06-06 DIAGNOSIS — Z1339 Encounter for screening examination for other mental health and behavioral disorders: Secondary | ICD-10-CM

## 2019-06-06 DIAGNOSIS — Z1331 Encounter for screening for depression: Secondary | ICD-10-CM

## 2019-06-06 NOTE — Progress Notes (Signed)
Routine Annual Gynecology Examination   PCP: Lawerance Cruel, MD  Chief Complaint  Patient presents with  . Annual Exam   History of Present Illness: Patient is a 63 y.o. G2P0020 presents for annual exam. The patient has no complaints today.   Menopausal bleeding: denies  Menopausal symptoms: mild hot flashes  Breast symptoms: denies  Last pap smear: 1 year ago.  Result Normal, HPV negative  Last mammogram: 1 week ago.  Result BiRads 1  Last Colonoscopy: 1 year ago. Normal. Repeat in 10 years  Past Medical History:  Diagnosis Date  . Abnormal mammogram   . Family history of adverse reaction to anesthesia    father very sensitive to all medications, give him half doses of meds.  . Leiomyoma of uterus, unspecified   . Osteopenia     Past Surgical History:  Procedure Laterality Date  . COLONOSCOPY    . COLONOSCOPY WITH PROPOFOL N/A 07/17/2018   Procedure: COLONOSCOPY WITH PROPOFOL;  Surgeon: Robert Bellow, MD;  Location: [facility] ENDOSCOPY;  Service: Endoscopy;  Laterality: N/A;  . RETINAL DETACHMENT SURGERY Bilateral 2015,2017    Prior to Admission medications   Medication Sig Start Date End Date Taking? Authorizing Provider  Calcium Carbonate-Vit D-Min (CALCIUM 1200 PO) Take by mouth.   Yes [provider]  cholecalciferol (VITAMIN D) 1000 units tablet Take 1,000 Units by mouth daily.   Yes [provider]  Glucosamine-Chondroitin (GLUCOSAMINE CHONDR COMPLEX PO) Take 1 tablet by mouth 2 (two) times daily.   Yes [provider]  Multiple Vitamins-Minerals (CENTRUM SILVER 50+WOMEN PO) Take 1 tablet by mouth daily.   Yes [provider]  TURMERIC PO Take 1 tablet by mouth daily.   Yes [provider]   Allergies: No Known Allergies  Obstetric History: [id number]  Social History   Socioeconomic History  . Marital status: Married    Spouse name: Not on file  . Number of children: Not on file  . Years of education:  Not on file  . Highest education level: Not on file  Occupational History  . Not on file  Social Needs  . Financial resource strain: Not on file  . Food insecurity    Worry: Not on file    Inability: Not on file  . Transportation needs    Medical: Not on file    Non-medical: Not on file  Tobacco Use  . Smoking status: Never Smoker  . Smokeless tobacco: Never Used  Substance and Sexual Activity  . Alcohol use: No  . Drug use: No  . Sexual activity: Not Currently    Birth control/protection: Post-menopausal  Lifestyle  . Physical activity    Days per week: Not on file    Minutes per session: Not on file  . Stress: Not on file  Relationships  . Social Herbalist on phone: Not on file    Gets together: Not on file    Attends religious service: Not on file    Active member of club or organization: Not on file    Attends meetings of clubs or organizations: Not on file    Relationship status: Not on file  . Intimate partner violence    Fear of current or ex partner: Not on file    Emotionally abused: Not on file    Physically abused: Not on file    Forced sexual activity: Not on file  Other Topics Concern  . Not on file  Social History Narrative  .  Not on file    Family History  Problem Relation Age of Onset  . Breast cancer Maternal Grandmother   . Hypertension Mother   . Stroke Paternal Grandfather     Review of Systems  Constitutional: Negative.   HENT: Negative.   Eyes: Negative.   Respiratory: Negative.   Cardiovascular: Negative.   Gastrointestinal: Negative.   Genitourinary: Negative.   Musculoskeletal: Positive for joint pain. Negative for back pain, falls, myalgias and neck pain.  Skin: Negative.   Neurological: Positive for dizziness (left ear issues). Negative for tingling, tremors, sensory change, speech change, focal weakness, seizures, loss of consciousness, weakness and headaches.  Psychiatric/Behavioral: Negative.      Physical Exam  Vitals: BP 124/78   Ht 5\' 4"  (1.626 m)   Wt 149 lb (67.6 kg)   BMI 25.58 kg/m   Physical Exam Constitutional:      General: She is not in acute distress.    Appearance: Normal appearance. She is well-developed.  Genitourinary:     Pelvic exam was performed with patient supine.     Vulva, urethra, bladder and uterus normal.     No inguinal adenopathy present in the right or left side.    No signs of injury in the vagina.     No vaginal discharge, erythema, tenderness or bleeding.     No cervical motion tenderness, discharge, lesion or polyp.     Uterus is mobile.     Uterus is not enlarged or tender.     No uterine mass detected.    Uterus is anteverted.     No right or left adnexal mass present.     Right adnexa not tender or full.     Left adnexa not tender or full.  HENT:     Head: Normocephalic and atraumatic.  Eyes:     General: No scleral icterus.    Conjunctiva/sclera: Conjunctivae normal.  Neck:     Musculoskeletal: Normal range of motion and neck supple.     Thyroid: No thyromegaly.  Cardiovascular:     Rate and Rhythm: Normal rate and regular rhythm.     Heart sounds: No murmur. No friction rub. No gallop.   Pulmonary:     Effort: Pulmonary effort is normal. No respiratory distress.     Breath sounds: Normal breath sounds. No wheezing or rales.  Chest:     Breasts:        Right: No inverted nipple, mass, nipple discharge, skin change or tenderness.        Left: No inverted nipple, mass, nipple discharge, skin change or tenderness.  Abdominal:     General: Bowel sounds are normal. There is no distension.     Palpations: Abdomen is soft. There is no mass.     Tenderness: There is no abdominal tenderness. There is no guarding or rebound.  Musculoskeletal: Normal range of motion.        General: No swelling or tenderness.  Lymphadenopathy:     Cervical: No cervical adenopathy.     Lower Body: No right inguinal adenopathy. No left inguinal adenopathy.   Neurological:     General: No focal deficit present.     Mental Status: She is alert and oriented to person, place, and time.     Cranial Nerves: No cranial nerve deficit.  Skin:    General: Skin is warm and dry.     Findings: No erythema or rash.  Psychiatric:        Mood and  Affect: Mood normal.        Behavior: Behavior normal.        Judgment: Judgment normal.      Female chaperone present for pelvic and breast  portions of the physical exam  Results: AUDIT Questionnaire (screen for alcoholism): 1 PHQ-9: 0   Assessment and Plan:  63 y.o. G10P0020 female here for routine annual gynecologic examination  Plan: Problem List Items Addressed This Visit    None    Visit Diagnoses    Women's annual routine gynecological examination    -  Primary   Screening for depression       Screening for alcoholism         Screening: -- Blood pressure screen normal -- Colonoscopy - not due -- Mammogram - not due -- Weight screening: normal -- Depression screening negative (PHQ-9) -- Nutrition: normal -- cholesterol screening: per PCP -- osteoporosis screening: not due -- tobacco screening: not using -- alcohol screening: AUDIT questionnaire indicates low-risk usage. -- family history of breast cancer screening: done. not at high risk. -- no evidence of domestic violence or intimate partner violence. -- STD screening: gonorrhea/chlamydia NAAT not collected per patient request. -- pap smear not collected per ASCCP guidelines -- flu vaccine received and she received her shingles shot (not the 2nd yet) -- HPV vaccination series: not eligilbe  Prentice Docker, MD 06/06/2019 11:11 AM

## 2019-06-18 ENCOUNTER — Other Ambulatory Visit: Payer: Self-pay

## 2019-06-18 ENCOUNTER — Encounter: Payer: Self-pay | Admitting: Podiatry

## 2019-06-18 ENCOUNTER — Ambulatory Visit: Payer: BC Managed Care – PPO | Admitting: Podiatry

## 2019-06-18 DIAGNOSIS — M722 Plantar fascial fibromatosis: Secondary | ICD-10-CM | POA: Diagnosis not present

## 2019-06-18 DIAGNOSIS — M205X9 Other deformities of toe(s) (acquired), unspecified foot: Secondary | ICD-10-CM

## 2019-06-18 NOTE — Progress Notes (Signed)
Subjective:   Patient ID: Amber Mendoza, female   DOB: 63 y.o.   MRN: [id number]   HPI Patient presents stating that she seems to be quite a bit improved in the right heel is doing a lot better and somewhat improved on the left foot.  Patient is interested in orthotics    ROS      Objective:  Physical Exam  Neurovascular status intact with patient noted to have significant improvement on the right plantar heel upon palpation with mild discomfort in the left forefoot and mild bunion deformity left over right     Assessment:  Plantar fasciitis improved right capsulitis left bunion deformity     Plan:  H&P reviewed conditions and at this point casted for functional orthotics to reduce all stresses on the feet and to reduce pressure on the right heel along with the forefoot.  I then discussed bunion and the consideration someday for correction

## 2019-07-15 ENCOUNTER — Other Ambulatory Visit: Payer: Self-pay

## 2019-07-15 ENCOUNTER — Ambulatory Visit: Payer: BC Managed Care – PPO | Admitting: Orthotics

## 2019-07-15 DIAGNOSIS — M722 Plantar fascial fibromatosis: Secondary | ICD-10-CM

## 2019-07-15 NOTE — Progress Notes (Signed)

## 2020-03-30 ENCOUNTER — Other Ambulatory Visit: Payer: Self-pay | Admitting: Obstetrics and Gynecology

## 2020-03-30 DIAGNOSIS — Z1231 Encounter for screening mammogram for malignant neoplasm of breast: Secondary | ICD-10-CM

## 2020-06-07 ENCOUNTER — Ambulatory Visit (INDEPENDENT_AMBULATORY_CARE_PROVIDER_SITE_OTHER): Payer: BC Managed Care – PPO | Admitting: Obstetrics and Gynecology

## 2020-06-07 ENCOUNTER — Encounter: Payer: Self-pay | Admitting: Obstetrics and Gynecology

## 2020-06-07 ENCOUNTER — Other Ambulatory Visit: Payer: Self-pay

## 2020-06-07 VITALS — BP 128/84 | Ht 64.0 in | Wt 143.0 lb

## 2020-06-07 DIAGNOSIS — Z1331 Encounter for screening for depression: Secondary | ICD-10-CM

## 2020-06-07 DIAGNOSIS — Z01419 Encounter for gynecological examination (general) (routine) without abnormal findings: Secondary | ICD-10-CM

## 2020-06-07 DIAGNOSIS — Z1339 Encounter for screening examination for other mental health and behavioral disorders: Secondary | ICD-10-CM | POA: Diagnosis not present

## 2020-06-07 NOTE — Progress Notes (Signed)
Routine Annual Gynecology Examination   PCP: Lawerance Cruel, MD  Chief Complaint  Patient presents with  . Annual Exam   History of Present Illness: Patient is a 64 y.o. G2P0020 presents for annual exam. The patient has no complaints today.   Menopausal bleeding: denies  Menopausal symptoms: denies  Breast symptoms: denies  Last pap smear: 2 years ago.  Result Normal  Last mammogram: 1 year ago.  Result Normal. Scheduled for 06/15/2020.   Last Colonoscopy: 2 year ago. Normal. Repeat in 10 years  Her PCP: [facility] in [place], Dr. Harrington Challenger.    Past Medical History:  Diagnosis Date  . Abnormal mammogram   . Family history of adverse reaction to anesthesia    father very sensitive to all medications, give him half doses of meds.  . Leiomyoma of uterus, unspecified   . Osteopenia     Past Surgical History:  Procedure Laterality Date  . COLONOSCOPY    . COLONOSCOPY WITH PROPOFOL N/A 07/17/2018   Procedure: COLONOSCOPY WITH PROPOFOL;  Surgeon: Robert Bellow, MD;  Location: [facility] ENDOSCOPY;  Service: Endoscopy;  Laterality: N/A;  . RETINAL DETACHMENT SURGERY Bilateral 2015,2017    Prior to Admission medications   Medication Sig Start Date End Date Taking? Authorizing Provider  Calcium Carbonate-Vit D-Min (CALCIUM 1200 PO) Take by mouth.   Yes [provider]  Glucosamine-Chondroitin (GLUCOSAMINE CHONDR COMPLEX PO) Take 1 tablet by mouth 2 (two) times daily.   Yes [provider]  Multiple Vitamins-Minerals (CENTRUM SILVER 50+WOMEN PO) Take 1 tablet by mouth daily.   Yes [provider]  TURMERIC PO Take 1 tablet by mouth daily.   Yes [provider]   Allergies: No Known Allergies  Obstetric History: [id number]  Social History   Socioeconomic History  . Marital status: Married    Spouse name: Not on file  . Number of children: Not on file  . Years of education: Not on file  . Highest education level: Not on file  Occupational  History  . Not on file  Tobacco Use  . Smoking status: Never Smoker  . Smokeless tobacco: Never Used  Substance and Sexual Activity  . Alcohol use: No  . Drug use: No  . Sexual activity: Not Currently    Birth control/protection: Post-menopausal  Other Topics Concern  . Not on file  Social History Narrative  . Not on file   Social Determinants of Health   Financial Resource Strain:   . Difficulty of Paying Living Expenses: Not on file  Food Insecurity:   . Worried About Charity fundraiser in the Last Year: Not on file  . Ran Out of Food in the Last Year: Not on file  Transportation Needs:   . Lack of Transportation (Medical): Not on file  . Lack of Transportation (Non-Medical): Not on file  Physical Activity:   . Days of Exercise per Week: Not on file  . Minutes of Exercise per Session: Not on file  Stress:   . Feeling of Stress : Not on file  Social Connections:   . Frequency of Communication with Friends and Family: Not on file  . Frequency of Social Gatherings with Friends and Family: Not on file  . Attends Religious Services: Not on file  . Active Member of Clubs or Organizations: Not on file  . Attends Archivist Meetings: Not on file  . Marital Status: Not on file  Intimate Partner Violence:   . Fear of Current or  Ex-Partner: Not on file  . Emotionally Abused: Not on file  . Physically Abused: Not on file  . Sexually Abused: Not on file    Family History  Problem Relation Age of Onset  . Breast cancer Maternal Grandmother   . Hypertension Mother   . Stroke Paternal Grandfather     Review of Systems  Constitutional: Negative.   HENT: Negative.   Eyes: Negative.   Respiratory: Negative.   Cardiovascular: Negative.   Gastrointestinal: Negative.   Genitourinary: Negative.   Musculoskeletal: Negative.   Skin: Negative.   Neurological: Negative.   Psychiatric/Behavioral: Negative.    Physical Exam Vitals: BP 128/84   Ht 5\' 4"  (1.626 m)    Wt 143 lb (64.9 kg)   BMI 24.55 kg/m   Physical Exam Constitutional:      General: She is not in acute distress.    Appearance: Normal appearance. She is well-developed.  Genitourinary:     Pelvic exam was performed with patient in the lithotomy position.     Vulva, urethra, bladder and uterus normal.     No inguinal adenopathy present in the right or left side.    No signs of injury in the vagina.     No vaginal discharge, erythema, tenderness or bleeding.     No cervical motion tenderness, discharge, lesion or polyp.     Uterus is mobile.     Uterus is not enlarged or tender.     No uterine mass detected.    Uterus is anteverted.     No right or left adnexal mass present.     Right adnexa not tender or full.     Left adnexa not tender or full.  HENT:     Head: Normocephalic and atraumatic.  Eyes:     General: No scleral icterus.    Conjunctiva/sclera: Conjunctivae normal.  Neck:     Thyroid: No thyromegaly.  Cardiovascular:     Rate and Rhythm: Normal rate and regular rhythm.     Heart sounds: No murmur heard.  No friction rub. No gallop.   Pulmonary:     Effort: Pulmonary effort is normal. No respiratory distress.     Breath sounds: Normal breath sounds. No wheezing or rales.  Chest:     Breasts:        Right: No inverted nipple, mass, nipple discharge, skin change or tenderness.        Left: No inverted nipple, mass, nipple discharge, skin change or tenderness.  Abdominal:     General: Bowel sounds are normal. There is no distension.     Palpations: Abdomen is soft. There is no mass.     Tenderness: There is no abdominal tenderness. There is no guarding or rebound.  Musculoskeletal:        General: No swelling or tenderness. Normal range of motion.     Cervical back: Normal range of motion and neck supple.  Lymphadenopathy:     Cervical: No cervical adenopathy.     Lower Body: No right inguinal adenopathy. No left inguinal adenopathy.  Neurological:     General:  No focal deficit present.     Mental Status: She is alert and oriented to person, place, and time.     Cranial Nerves: No cranial nerve deficit.  Skin:    General: Skin is warm and dry.     Findings: No erythema or rash.  Psychiatric:        Mood and Affect: Mood normal.  Behavior: Behavior normal.        Judgment: Judgment normal.      Female chaperone present for pelvic and breast  portions of the physical exam  Results: AUDIT Questionnaire (screen for alcoholism): 0 PHQ-9: 1   Assessment and Plan:  64 y.o. G43P0020 female here for routine annual gynecologic examination  Plan: Problem List Items Addressed This Visit    None    Visit Diagnoses    Women's annual routine gynecological examination    -  Primary   Screening for depression       Screening for alcoholism          Screening: -- Blood pressure screen normal -- Colonoscopy - not due -- Mammogram - due - already scheduled at [facility] on 06/15/2020 -- Weight screening: normal -- Depression screening negative (PHQ-9) -- Nutrition: normal -- cholesterol screening: per PCP -- osteoporosis screening: per PCP -- tobacco screening: not using -- alcohol screening: AUDIT questionnaire indicates low-risk usage. -- family history of breast cancer screening: done. not at high risk. -- no evidence of domestic violence or intimate partner violence. -- STD screening: gonorrhea/chlamydia NAAT not collected per patient request. -- pap smear not collected per ASCCP guidelines -- flu vaccine has not yet received. Plans to receive.  -- HPV vaccination series: not eligilbe  -- She has received her COVID19 vaccine, though she tested positive for COVID somewhat recently.  No symptoms today.    Prentice Docker, MD 06/07/2020 9:46 AM

## 2020-06-15 ENCOUNTER — Ambulatory Visit
Admission: RE | Admit: 2020-06-15 | Discharge: 2020-06-15 | Disposition: A | Discharge status: Home / Self Care | Payer: BC Managed Care – PPO | Source: Ambulatory Visit | Attending: Obstetrics and Gynecology | Admitting: Obstetrics and Gynecology

## 2020-06-15 ENCOUNTER — Other Ambulatory Visit: Payer: Self-pay

## 2020-06-15 DIAGNOSIS — Z1231 Encounter for screening mammogram for malignant neoplasm of breast: Secondary | ICD-10-CM | POA: Diagnosis present

## 2021-03-28 ENCOUNTER — Other Ambulatory Visit: Payer: Self-pay | Admitting: Obstetrics and Gynecology

## 2021-03-28 DIAGNOSIS — Z1231 Encounter for screening mammogram for malignant neoplasm of breast: Secondary | ICD-10-CM

## 2021-06-16 ENCOUNTER — Other Ambulatory Visit: Payer: Self-pay

## 2021-06-16 ENCOUNTER — Ambulatory Visit
Admission: RE | Admit: 2021-06-16 | Discharge: 2021-06-16 | Disposition: A | Discharge status: Home / Self Care | Payer: BC Managed Care – PPO | Source: Ambulatory Visit | Attending: Obstetrics and Gynecology | Admitting: Obstetrics and Gynecology

## 2021-06-16 DIAGNOSIS — Z1231 Encounter for screening mammogram for malignant neoplasm of breast: Secondary | ICD-10-CM | POA: Insufficient documentation

## 2021-06-20 ENCOUNTER — Ambulatory Visit (INDEPENDENT_AMBULATORY_CARE_PROVIDER_SITE_OTHER): Payer: BC Managed Care – PPO | Admitting: Obstetrics and Gynecology

## 2021-06-20 ENCOUNTER — Encounter: Payer: Self-pay | Admitting: Obstetrics and Gynecology

## 2021-06-20 ENCOUNTER — Other Ambulatory Visit: Payer: Self-pay

## 2021-06-20 VITALS — BP 138/74 | HR 76 | Ht 64.0 in | Wt 143.0 lb

## 2021-06-20 DIAGNOSIS — Z1331 Encounter for screening for depression: Secondary | ICD-10-CM

## 2021-06-20 DIAGNOSIS — Z01419 Encounter for gynecological examination (general) (routine) without abnormal findings: Secondary | ICD-10-CM | POA: Diagnosis not present

## 2021-06-20 DIAGNOSIS — Z1339 Encounter for screening examination for other mental health and behavioral disorders: Secondary | ICD-10-CM

## 2021-06-20 DIAGNOSIS — Z23 Encounter for immunization: Secondary | ICD-10-CM | POA: Diagnosis not present

## 2021-06-20 NOTE — Progress Notes (Addendum)
Routine Annual Gynecology Examination   PCP: Lawerance Cruel, MD  Chief Complaint  Patient presents with   Annual Exam   History of Present Illness: Patient is a 65 y.o. G2P0020 presents for annual exam. The patient has no complaints today.   Menopausal bleeding: denies  Menopausal symptoms: denies. Occasionally gets warm. "Not really hot flashes"  Breast symptoms: denies  Last pap smear: 3 years ago.  Result Normal  Last mammogram: 4 days ago.  Result Normal.   Last Colonoscopy: 3 year ago. Normal. Repeat in 10 years  Trying to do more exercise in general. She does walk regularly.    Her PCP: [facility] in [place], Dr. Harrington Challenger.    Past Medical History:  Diagnosis Date   Abnormal mammogram    Family history of adverse reaction to anesthesia    father very sensitive to all medications, give him half doses of meds.   Leiomyoma of uterus, unspecified    Osteopenia     Past Surgical History:  Procedure Laterality Date   COLONOSCOPY     COLONOSCOPY WITH PROPOFOL N/A 07/17/2018   Procedure: COLONOSCOPY WITH PROPOFOL;  Surgeon: Robert Bellow, MD;  Location: [facility] ENDOSCOPY;  Service: Endoscopy;  Laterality: N/A;   RETINAL DETACHMENT SURGERY Bilateral 2015,2017    Prior to Admission medications   Medication Sig Start Date End Date Taking? Authorizing Provider  Calcium Carbonate-Vit D-Min (CALCIUM 1200 PO) Take by mouth.   Yes [provider]  Glucosamine-Chondroitin (GLUCOSAMINE CHONDR COMPLEX PO) Take 1 tablet by mouth 2 (two) times daily.   Yes [provider]  Multiple Vitamins-Minerals (CENTRUM SILVER 50+WOMEN PO) Take 1 tablet by mouth daily.   Yes [provider]  TURMERIC PO Take 1 tablet by mouth daily.   Yes [provider]   Allergies: No Known Allergies  Obstetric History: G2P0020  Social History   Socioeconomic History   Marital status: Married    Spouse name: Not on file   Number of children: Not on file    Years of education: Not on file   Highest education level: Not on file  Occupational History   Not on file  Tobacco Use   Smoking status: Never   Smokeless tobacco: Never  Substance and Sexual Activity   Alcohol use: No   Drug use: No   Sexual activity: Not Currently    Birth control/protection: Post-menopausal  Other Topics Concern   Not on file  Social History Narrative   Not on file   Social Determinants of Health   Financial Resource Strain: Not on file  Food Insecurity: Not on file  Transportation Needs: Not on file  Physical Activity: Not on file  Stress: Not on file  Social Connections: Not on file  Intimate Partner Violence: Not on file    Family History  Problem Relation Age of Onset   Breast cancer Maternal Grandmother    Hypertension Mother    Stroke Paternal Grandfather     Review of Systems  Constitutional: Negative.   HENT: Negative.    Eyes: Negative.   Respiratory: Negative.    Cardiovascular: Negative.   Gastrointestinal: Negative.   Genitourinary: Negative.   Musculoskeletal: Negative.   Skin: Negative.   Neurological: Negative.   Psychiatric/Behavioral: Negative.     Physical Exam Vitals: BP 138/74 (Cuff Size: Normal)   Pulse 76   Ht 5\' 4"  (1.626 m)   Wt 143 lb (64.9 kg)   BMI 24.55 kg/m   Physical Exam Constitutional:  General: She is not in acute distress.    Appearance: Normal appearance. She is well-developed.  Genitourinary:     Vulva and bladder normal.     No vaginal discharge, erythema, tenderness or bleeding.      Right Adnexa: not tender, not full and no mass present.    Left Adnexa: not tender, not full and no mass present.    No cervical motion tenderness, discharge, lesion or polyp.     Uterus is not enlarged or tender.     No uterine mass detected.    Pelvic exam was performed with patient in the lithotomy position.  Breasts:    Right: No inverted nipple, mass, nipple discharge, skin change or tenderness.      Left: No inverted nipple, mass, nipple discharge, skin change or tenderness.  HENT:     Head: Normocephalic and atraumatic.  Eyes:     General: No scleral icterus.    Conjunctiva/sclera: Conjunctivae normal.  Neck:     Thyroid: No thyromegaly.  Cardiovascular:     Rate and Rhythm: Normal rate and regular rhythm.     Heart sounds: No murmur heard.   No friction rub. No gallop.  Pulmonary:     Effort: Pulmonary effort is normal. No respiratory distress.     Breath sounds: Normal breath sounds. No wheezing or rales.  Abdominal:     General: Bowel sounds are normal. There is no distension.     Palpations: Abdomen is soft. There is no mass.     Tenderness: There is no abdominal tenderness. There is no guarding or rebound.  Musculoskeletal:        General: No swelling or tenderness. Normal range of motion.     Cervical back: Normal range of motion and neck supple.  Lymphadenopathy:     Cervical: No cervical adenopathy.     Lower Body: No right inguinal adenopathy. No left inguinal adenopathy.  Neurological:     General: No focal deficit present.     Mental Status: She is alert and oriented to person, place, and time.     Cranial Nerves: No cranial nerve deficit.  Skin:    General: Skin is warm and dry.     Findings: No erythema or rash.  Psychiatric:        Mood and Affect: Mood normal.        Behavior: Behavior normal.        Judgment: Judgment normal.     Female chaperone present for pelvic and breast  portions of the physical exam  Results: AUDIT Questionnaire (screen for alcoholism): 1 PHQ-9: 1   Assessment and Plan:  65 y.o. G47P0020 female here for routine annual gynecologic examination  Plan: Problem List Items Addressed This Visit   None Visit Diagnoses     Need for immunization against influenza    -  Primary   Relevant Orders   Flu Vaccine QUAD 62mo+IM (Fluarix, Fluzone & Alfiuria Quad PF) (Completed)       Screening: -- Blood pressure screen normal --  Colonoscopy - not due -- Mammogram - not due -- Weight screening: normal -- Depression screening negative (PHQ-9) -- Nutrition: normal -- cholesterol screening: per PCP -- osteoporosis screening:  per PCP -- tobacco screening: not using -- alcohol screening: AUDIT questionnaire indicates low-risk usage. -- family history of breast cancer screening: done. not at high risk. -- no evidence of domestic violence or intimate partner violence. -- STD screening: gonorrhea/chlamydia NAAT not collected per patient request. --  pap smear not collected per ASCCP guidelines -- flu vaccine: receives today -- HPV vaccination series: not eligilbe   Prentice Docker, MD 06/20/2021 10:59 AM

## 2022-04-21 DIAGNOSIS — H6592 Unspecified nonsuppurative otitis media, left ear: Secondary | ICD-10-CM | POA: Diagnosis not present

## 2022-08-01 DIAGNOSIS — D2262 Melanocytic nevi of left upper limb, including shoulder: Secondary | ICD-10-CM | POA: Diagnosis not present

## 2022-08-01 DIAGNOSIS — D2272 Melanocytic nevi of left lower limb, including hip: Secondary | ICD-10-CM | POA: Diagnosis not present

## 2022-08-01 DIAGNOSIS — L814 Other melanin hyperpigmentation: Secondary | ICD-10-CM | POA: Diagnosis not present

## 2022-08-01 DIAGNOSIS — D2271 Melanocytic nevi of right lower limb, including hip: Secondary | ICD-10-CM | POA: Diagnosis not present

## 2022-08-01 DIAGNOSIS — D2261 Melanocytic nevi of right upper limb, including shoulder: Secondary | ICD-10-CM | POA: Diagnosis not present

## 2022-08-01 DIAGNOSIS — X32XXXA Exposure to sunlight, initial encounter: Secondary | ICD-10-CM | POA: Diagnosis not present

## 2022-08-01 DIAGNOSIS — D225 Melanocytic nevi of trunk: Secondary | ICD-10-CM | POA: Diagnosis not present

## 2022-08-08 DIAGNOSIS — R6889 Other general symptoms and signs: Secondary | ICD-10-CM | POA: Diagnosis not present

## 2022-08-08 DIAGNOSIS — J101 Influenza due to other identified influenza virus with other respiratory manifestations: Secondary | ICD-10-CM | POA: Diagnosis not present

## 2022-08-08 DIAGNOSIS — R0981 Nasal congestion: Secondary | ICD-10-CM | POA: Diagnosis not present

## 2022-08-08 DIAGNOSIS — R52 Pain, unspecified: Secondary | ICD-10-CM | POA: Diagnosis not present

## 2022-08-08 DIAGNOSIS — R051 Acute cough: Secondary | ICD-10-CM | POA: Diagnosis not present

## 2022-10-03 ENCOUNTER — Other Ambulatory Visit: Payer: Self-pay | Admitting: Family Medicine

## 2022-10-03 DIAGNOSIS — Z1231 Encounter for screening mammogram for malignant neoplasm of breast: Secondary | ICD-10-CM

## 2022-10-23 ENCOUNTER — Ambulatory Visit
Admission: RE | Admit: 2022-10-23 | Discharge: 2022-10-23 | Disposition: A | Discharge status: Home / Self Care | Payer: Medicare PPO | Source: Ambulatory Visit | Attending: Family Medicine | Admitting: Family Medicine

## 2022-10-23 DIAGNOSIS — Z1231 Encounter for screening mammogram for malignant neoplasm of breast: Secondary | ICD-10-CM | POA: Diagnosis not present

## 2023-01-23 DIAGNOSIS — E7800 Pure hypercholesterolemia, unspecified: Secondary | ICD-10-CM | POA: Diagnosis not present

## 2023-01-23 DIAGNOSIS — E559 Vitamin D deficiency, unspecified: Secondary | ICD-10-CM | POA: Diagnosis not present

## 2023-01-23 DIAGNOSIS — Z131 Encounter for screening for diabetes mellitus: Secondary | ICD-10-CM | POA: Diagnosis not present

## 2023-01-30 DIAGNOSIS — E559 Vitamin D deficiency, unspecified: Secondary | ICD-10-CM | POA: Diagnosis not present

## 2023-01-30 DIAGNOSIS — M25551 Pain in right hip: Secondary | ICD-10-CM | POA: Diagnosis not present

## 2023-01-30 DIAGNOSIS — M19049 Primary osteoarthritis, unspecified hand: Secondary | ICD-10-CM | POA: Diagnosis not present

## 2023-01-30 DIAGNOSIS — Z0000 Encounter for general adult medical examination without abnormal findings: Secondary | ICD-10-CM | POA: Diagnosis not present

## 2023-01-30 DIAGNOSIS — E7800 Pure hypercholesterolemia, unspecified: Secondary | ICD-10-CM | POA: Diagnosis not present

## 2023-01-30 DIAGNOSIS — Z6825 Body mass index (BMI) 25.0-25.9, adult: Secondary | ICD-10-CM | POA: Diagnosis not present

## 2023-01-30 DIAGNOSIS — Z79899 Other long term (current) drug therapy: Secondary | ICD-10-CM | POA: Diagnosis not present

## 2023-02-12 DIAGNOSIS — M25651 Stiffness of right hip, not elsewhere classified: Secondary | ICD-10-CM | POA: Diagnosis not present

## 2023-02-12 DIAGNOSIS — R531 Weakness: Secondary | ICD-10-CM | POA: Diagnosis not present

## 2023-02-13 DIAGNOSIS — M79644 Pain in right finger(s): Secondary | ICD-10-CM | POA: Diagnosis not present

## 2023-02-22 DIAGNOSIS — M25651 Stiffness of right hip, not elsewhere classified: Secondary | ICD-10-CM | POA: Diagnosis not present

## 2023-02-22 DIAGNOSIS — R531 Weakness: Secondary | ICD-10-CM | POA: Diagnosis not present

## 2023-02-26 DIAGNOSIS — M25651 Stiffness of right hip, not elsewhere classified: Secondary | ICD-10-CM | POA: Diagnosis not present

## 2023-02-26 DIAGNOSIS — R531 Weakness: Secondary | ICD-10-CM | POA: Diagnosis not present

## 2023-02-28 DIAGNOSIS — R531 Weakness: Secondary | ICD-10-CM | POA: Diagnosis not present

## 2023-02-28 DIAGNOSIS — M25651 Stiffness of right hip, not elsewhere classified: Secondary | ICD-10-CM | POA: Diagnosis not present

## 2023-03-15 DIAGNOSIS — M25651 Stiffness of right hip, not elsewhere classified: Secondary | ICD-10-CM | POA: Diagnosis not present

## 2023-03-15 DIAGNOSIS — R531 Weakness: Secondary | ICD-10-CM | POA: Diagnosis not present

## 2023-03-28 DIAGNOSIS — M25651 Stiffness of right hip, not elsewhere classified: Secondary | ICD-10-CM | POA: Diagnosis not present

## 2023-03-28 DIAGNOSIS — R531 Weakness: Secondary | ICD-10-CM | POA: Diagnosis not present

## 2023-04-11 DIAGNOSIS — R531 Weakness: Secondary | ICD-10-CM | POA: Diagnosis not present

## 2023-04-11 DIAGNOSIS — M25651 Stiffness of right hip, not elsewhere classified: Secondary | ICD-10-CM | POA: Diagnosis not present

## 2023-04-17 DIAGNOSIS — R531 Weakness: Secondary | ICD-10-CM | POA: Diagnosis not present

## 2023-04-17 DIAGNOSIS — M25651 Stiffness of right hip, not elsewhere classified: Secondary | ICD-10-CM | POA: Diagnosis not present

## 2023-04-17 IMAGING — MG MM DIGITAL SCREENING BILAT W/ TOMO AND CAD
6 of 10 series · 6 of 30 positions shown · non-contrast
Comparison: Previous exam(s).

CLINICAL DATA: Screening.

EXAM:
DIGITAL SCREENING BILATERAL MAMMOGRAM WITH TOMOSYNTHESIS AND CAD
TECHNIQUE: Bilateral screening digital craniocaudal and mediolateral oblique
mammograms were obtained. Bilateral screening digital breast
tomosynthesis was performed. The images were evaluated with
computer-aided detection.

[L CC synth-2D]
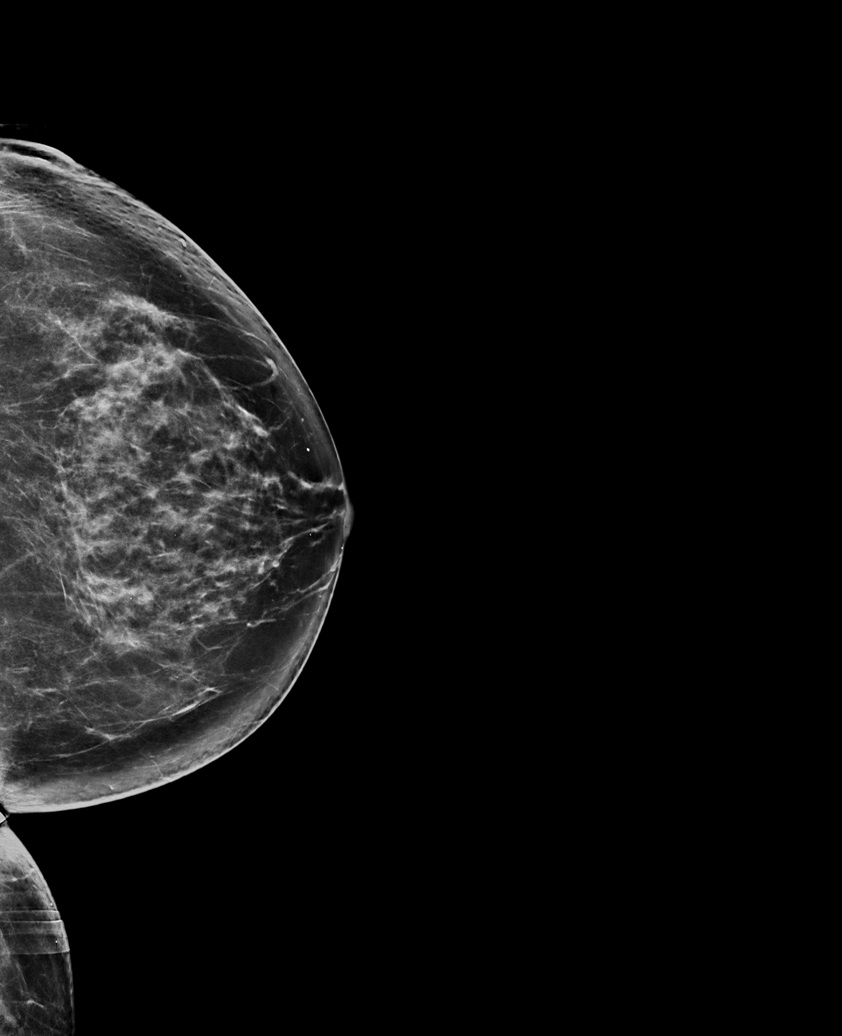

[L MLO synth-2D]
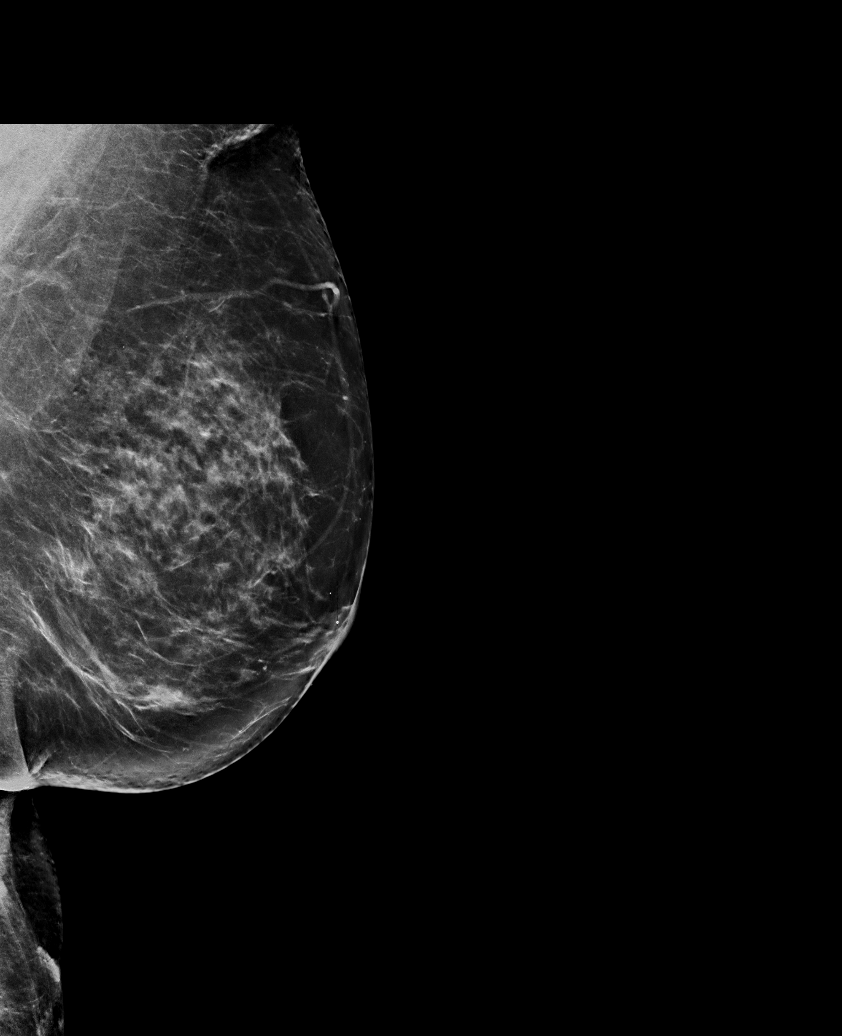

[R AT synth-2D]
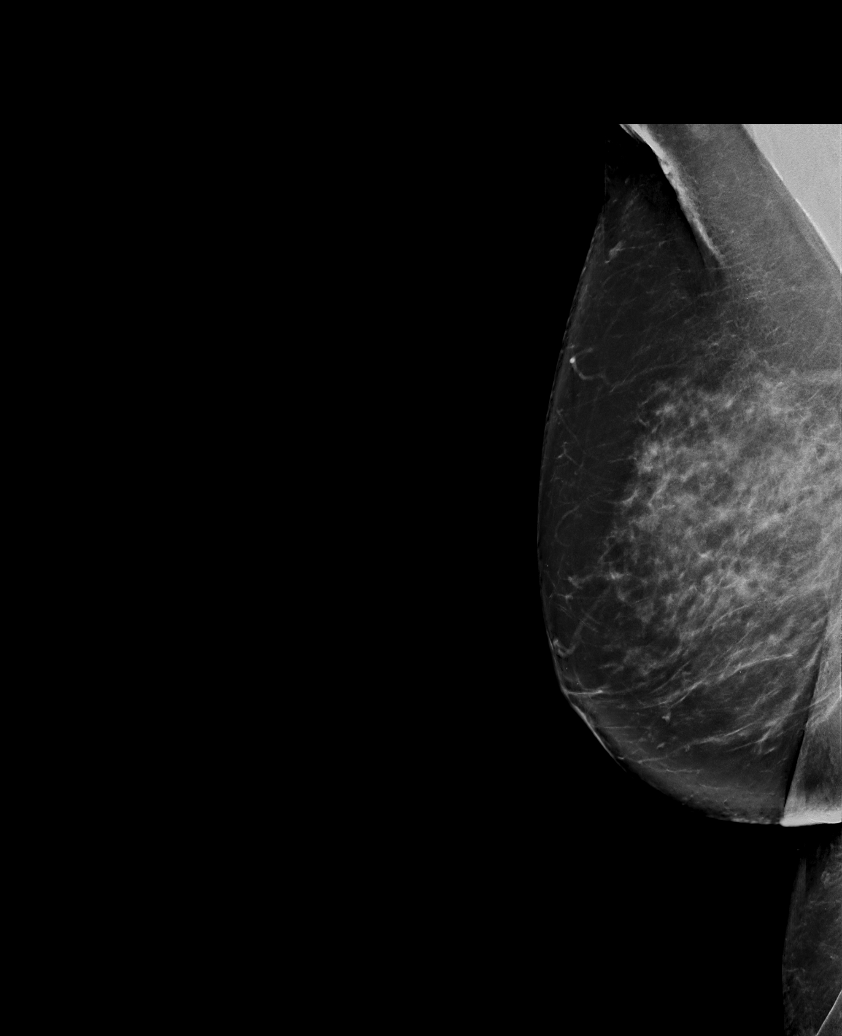

[R CC synth-2D]
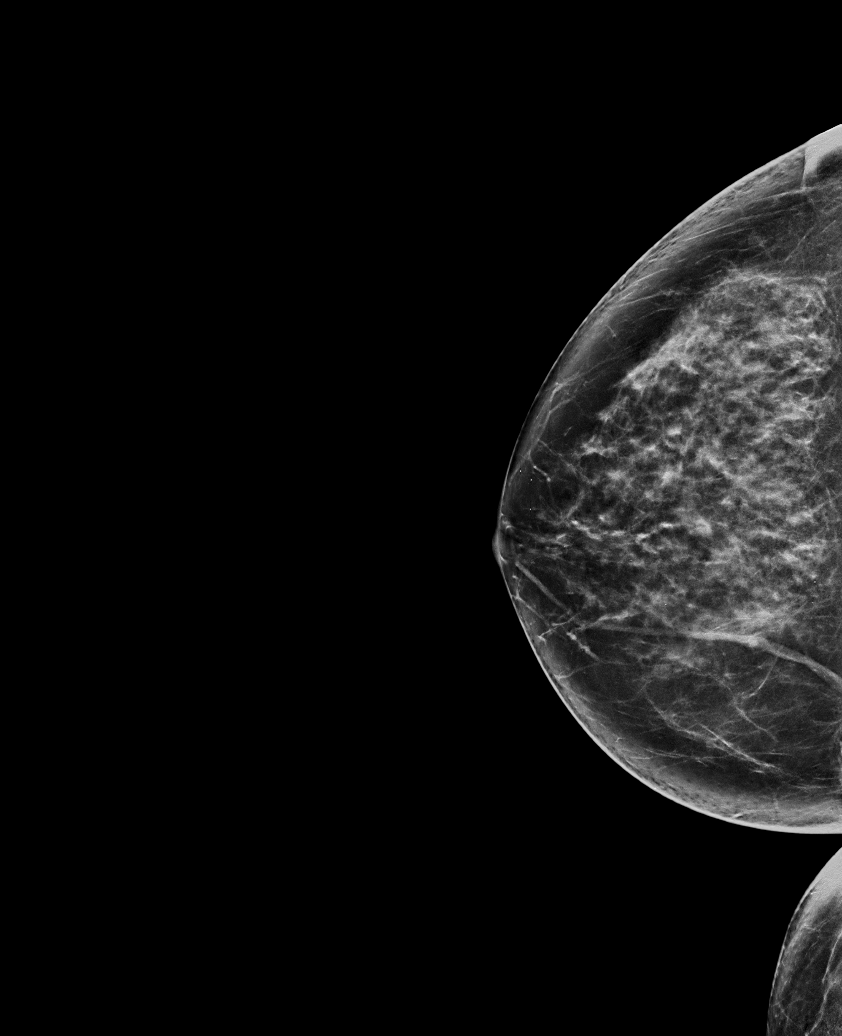

[R MLO synth-2D]
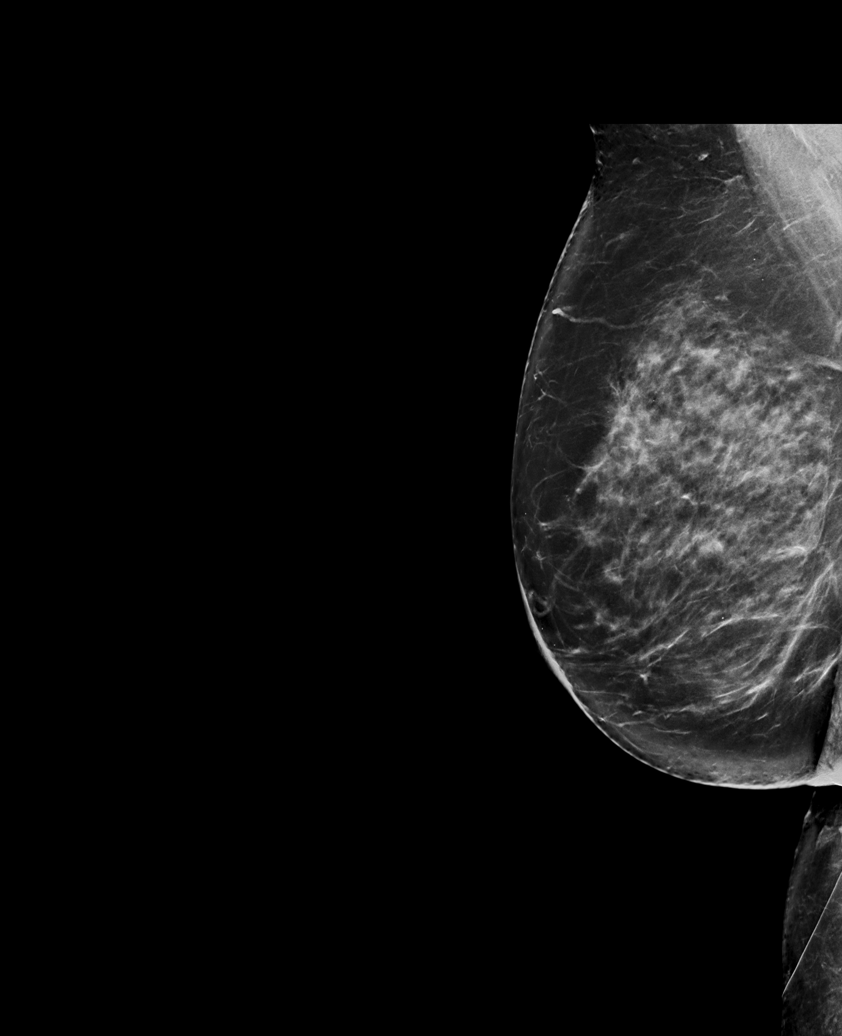

[R MLO tomo · tomo slice 43/86.0]
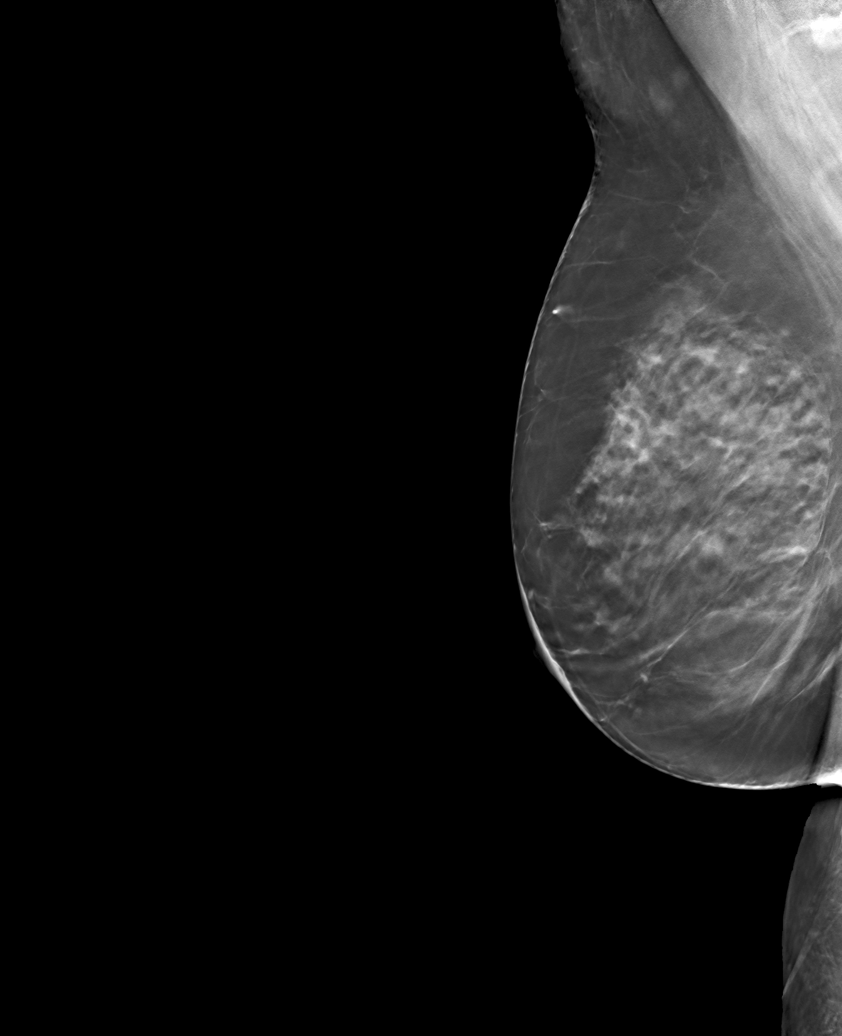

[6 of 30 positions shown; findings below may reference images not displayed]

ACR Breast Density Category c: The breast tissue is heterogeneously
dense, which may obscure small masses.
FINDINGS: There are no findings suspicious for malignancy.
IMPRESSION: No mammographic evidence of malignancy. A result letter of this
screening mammogram will be mailed directly to the patient.

RECOMMENDATION:
Screening mammogram in one year. (Code:Q3-W-BC3)

BI-RADS CATEGORY  1: Negative.

## 2023-04-30 DIAGNOSIS — R531 Weakness: Secondary | ICD-10-CM | POA: Diagnosis not present

## 2023-04-30 DIAGNOSIS — M25651 Stiffness of right hip, not elsewhere classified: Secondary | ICD-10-CM | POA: Diagnosis not present

## 2023-05-08 DIAGNOSIS — R531 Weakness: Secondary | ICD-10-CM | POA: Diagnosis not present

## 2023-05-08 DIAGNOSIS — M25651 Stiffness of right hip, not elsewhere classified: Secondary | ICD-10-CM | POA: Diagnosis not present

## 2023-05-16 DIAGNOSIS — M25651 Stiffness of right hip, not elsewhere classified: Secondary | ICD-10-CM | POA: Diagnosis not present

## 2023-05-16 DIAGNOSIS — R531 Weakness: Secondary | ICD-10-CM | POA: Diagnosis not present

## 2023-05-23 DIAGNOSIS — R531 Weakness: Secondary | ICD-10-CM | POA: Diagnosis not present

## 2023-05-23 DIAGNOSIS — M25651 Stiffness of right hip, not elsewhere classified: Secondary | ICD-10-CM | POA: Diagnosis not present

## 2023-05-28 DIAGNOSIS — M25651 Stiffness of right hip, not elsewhere classified: Secondary | ICD-10-CM | POA: Diagnosis not present

## 2023-05-28 DIAGNOSIS — R531 Weakness: Secondary | ICD-10-CM | POA: Diagnosis not present

## 2023-07-18 ENCOUNTER — Ambulatory Visit (HOSPITAL_COMMUNITY)
Admission: RE | Admit: 2023-07-18 | Discharge: 2023-07-18 | Disposition: A | Discharge status: Home / Self Care | Payer: Medicare PPO | Source: Ambulatory Visit | Attending: Cardiovascular Disease | Admitting: Cardiovascular Disease

## 2023-07-18 ENCOUNTER — Other Ambulatory Visit (HOSPITAL_COMMUNITY): Payer: Self-pay | Admitting: Family Medicine

## 2023-07-18 DIAGNOSIS — M79669 Pain in unspecified lower leg: Secondary | ICD-10-CM | POA: Diagnosis not present

## 2023-07-18 DIAGNOSIS — M79605 Pain in left leg: Secondary | ICD-10-CM

## 2023-07-21 ENCOUNTER — Emergency Department (HOSPITAL_BASED_OUTPATIENT_CLINIC_OR_DEPARTMENT_OTHER): Payer: Medicare PPO | Admitting: Radiology

## 2023-07-21 ENCOUNTER — Encounter (HOSPITAL_BASED_OUTPATIENT_CLINIC_OR_DEPARTMENT_OTHER): Payer: Self-pay | Admitting: Emergency Medicine

## 2023-07-21 ENCOUNTER — Emergency Department (HOSPITAL_BASED_OUTPATIENT_CLINIC_OR_DEPARTMENT_OTHER)
Admission: EM | Admit: 2023-07-21 | Discharge: 2023-07-21 | Disposition: A | Discharge status: Home / Self Care | Payer: Medicare PPO | Attending: Emergency Medicine | Admitting: Emergency Medicine

## 2023-07-21 ENCOUNTER — Other Ambulatory Visit: Payer: Self-pay

## 2023-07-21 DIAGNOSIS — R2689 Other abnormalities of gait and mobility: Secondary | ICD-10-CM | POA: Diagnosis not present

## 2023-07-21 DIAGNOSIS — M25562 Pain in left knee: Secondary | ICD-10-CM | POA: Diagnosis not present

## 2023-07-21 MED ORDER — METHYLPREDNISOLONE 4 MG PO TBPK: ORAL_TABLET | ORAL | 0 refills | Status: AC

## 2023-07-21 MED ORDER — OXYCODONE HCL 5 MG PO TABS
5.0000 mg | ORAL_TABLET | Freq: Once | ORAL | Status: AC
  Administered 2023-07-21: 5 mg via ORAL
  Filled 2023-07-21: qty 1

## 2023-07-21 MED ORDER — OXYCODONE HCL 5 MG PO TABS: 5.0000 mg | ORAL_TABLET | Freq: Four times a day (QID) | ORAL | 0 refills | Status: AC | PRN

## 2023-07-21 NOTE — ED Notes (Signed)
Pt had xray today at [facility] walk in

## 2023-07-21 NOTE — ED Provider Notes (Signed)
[facility] EMERGENCY DEPARTMENT AT [facility] Provider Note   CSN: [id number] Arrival date & time: 07/21/23  1417     History  Chief Complaint  Patient presents with   Knee Pain    Amber Mendoza is a 67 y.o. female.  Patient here with left knee pain for the last few weeks.  Worse today.  Had ultrasound done 2 days ago that was negative for blood clot but she did have a effusion on her knee.  She is having pain mostly to the anterior superior portion of the left knee.  No specific trauma.  She had x-rays done at urgent care today that were okay she states.  She denies any weakness numbness tingling.  The history is provided by the patient.       Home Medications Prior to Admission medications   Medication Sig Start Date End Date Taking? Authorizing Provider  methylPREDNISolone (MEDROL DOSEPAK) 4 MG TBPK tablet Follow package insert 07/21/23  Yes Falynn Ailey, DO  oxyCODONE (ROXICODONE) 5 MG immediate release tablet Take 1 tablet (5 mg total) by mouth every 6 (six) hours as needed for up to 10 doses. 07/21/23  Yes Brigitt Mcclish, DO  Calcium Carbonate-Vit D-Min (CALCIUM 1200 PO) Take by mouth.    [provider]  cholecalciferol (VITAMIN D) 1000 units tablet Take 1,000 Units by mouth daily. Patient not taking: No sig reported    [provider]  Glucosamine-Chondroitin (GLUCOSAMINE CHONDR COMPLEX PO) Take 1 tablet by mouth 2 (two) times daily.    [provider]  Multiple Vitamins-Minerals (CENTRUM SILVER 50+WOMEN PO) Take 1 tablet by mouth daily.    [provider]  polyethylene glycol powder (GLYCOLAX/MIRALAX) 17 GM/SCOOP powder Take by mouth.    [provider]  TURMERIC PO Take 1 tablet by mouth daily.    [provider]      Allergies    Patient has no known allergies.    Review of Systems   Review of Systems  Physical Exam Updated Vital Signs BP (!) 183/97 (BP Location: Left Arm)   Pulse 88   Temp  98.4 F (36.9 C) (Oral)   Resp 17   Ht 5\' 4"  (1.626 m)   Wt 64.9 kg   SpO2 97%   BMI 24.56 kg/m  Physical Exam Vitals and nursing note reviewed.  Constitutional:      General: She is not in acute distress.    Appearance: She is well-developed.  HENT:     Head: Normocephalic and atraumatic.  Eyes:     Extraocular Movements: Extraocular movements intact.     Conjunctiva/sclera: Conjunctivae normal.     Pupils: Pupils are equal, round, and reactive to light.  Cardiovascular:     Rate and Rhythm: Normal rate and regular rhythm.     Pulses: Normal pulses.     Heart sounds: No murmur heard. Pulmonary:     Effort: Pulmonary effort is normal. No respiratory distress.     Breath sounds: Normal breath sounds.  Abdominal:     Palpations: Abdomen is soft.     Tenderness: There is no abdominal tenderness.  Musculoskeletal:        General: Tenderness present. No swelling.     Cervical back: Neck supple.     Comments: Tenderness to the suprapatellar portion of the left knee, range of motion is intact, there is no redness or major swelling  Skin:    General: Skin is warm and dry.     Capillary Refill:  Capillary refill takes less than 2 seconds.  Neurological:     Mental Status: She is alert.     Sensory: No sensory deficit.     Motor: No weakness.  Psychiatric:        Mood and Affect: Mood normal.     ED Results / Procedures / Treatments   Labs (all labs ordered are listed, but only abnormal results are displayed) Labs Reviewed - No data to display  EKG None  Radiology No results found.  Procedures Procedures    Medications Ordered in ED Medications  oxyCODONE (Oxy IR/ROXICODONE) immediate release tablet 5 mg (5 mg Oral Given 07/21/23 1622)    ED Course/ Medical Decision Making/ A&P                                 Medical Decision Making Risk Prescription drug management.   Loette Fauerbach is here with left knee pain for the last few weeks.  DVT study 2 days  ago was unremarkable.  But there was a suprapatellar effusion.  There was no blood clot.  Discussed wrong pulses on exam.  Have no concern for peripheral arterial disease or occlusion.  I have no concern for septic joint.  I can flex and extend the knee without any major issues.  There is no major redness or swelling.  Compartments are soft.  I have no concern for compartment syndrome.  There is no warmth.  She had x-rays done at urgent care that were normal, I cannot see these x-rays.  She declined repeat x-rays.  Overall I have no concern really for trauma given the history and physical.  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  I do not think she needs any further imaging or workup at this time.  Will put her in an Ace wrap and have her use crutches and do Medrol Dosepak and Tylenol and ice and rest.  I will have her follow-up with sports medicine.  Discharged in good condition.  Understands return precautions.  This chart was dictated using voice recognition software.  Despite best efforts to proofread,  errors can occur which can change the documentation meaning.         Final Clinical Impression(s) / ED Diagnoses Final diagnoses:  Acute pain of left knee    Rx / DC Orders ED Discharge Orders          Ordered    methylPREDNISolone (MEDROL DOSEPAK) 4 MG TBPK tablet        07/21/23 1612    oxyCODONE (ROXICODONE) 5 MG immediate release tablet  Every 6 hours PRN        07/21/23 1612              Virgina Norfolk, DO 07/21/23 1707

## 2023-07-21 NOTE — ED Triage Notes (Signed)
Pt via pov from home with left knee pain x 2 weeks. Pt was seen by her pcp and had ultrasound earlier this week. She reports that it felt somewhat better yesterday, but today it began hurting worse and she has been unable to bear weight for a while (today). Pt alert & oriented, nad noted.

## 2023-07-21 NOTE — Discharge Instructions (Addendum)
Recommend 1000 mg of Tylenol every 6 hours as needed for pain.  Take Medrol Dosepak as prescribed.  Follow-up with orthopedic/sports medicine Dr. Thad Ranger.

## 2023-07-23 DIAGNOSIS — S83242A Other tear of medial meniscus, current injury, left knee, initial encounter: Secondary | ICD-10-CM | POA: Diagnosis not present

## 2023-07-23 DIAGNOSIS — M1712 Unilateral primary osteoarthritis, left knee: Secondary | ICD-10-CM | POA: Diagnosis not present

## 2023-07-27 DIAGNOSIS — M25562 Pain in left knee: Secondary | ICD-10-CM | POA: Diagnosis not present

## 2023-08-14 DIAGNOSIS — S83242A Other tear of medial meniscus, current injury, left knee, initial encounter: Secondary | ICD-10-CM | POA: Diagnosis not present

## 2023-08-14 DIAGNOSIS — M1712 Unilateral primary osteoarthritis, left knee: Secondary | ICD-10-CM | POA: Diagnosis not present

## 2023-08-22 DIAGNOSIS — M25662 Stiffness of left knee, not elsewhere classified: Secondary | ICD-10-CM | POA: Diagnosis not present

## 2023-08-22 DIAGNOSIS — S83242D Other tear of medial meniscus, current injury, left knee, subsequent encounter: Secondary | ICD-10-CM | POA: Diagnosis not present

## 2023-08-22 DIAGNOSIS — R531 Weakness: Secondary | ICD-10-CM | POA: Diagnosis not present

## 2023-08-22 DIAGNOSIS — R262 Difficulty in walking, not elsewhere classified: Secondary | ICD-10-CM | POA: Diagnosis not present

## 2023-08-29 DIAGNOSIS — S83242D Other tear of medial meniscus, current injury, left knee, subsequent encounter: Secondary | ICD-10-CM | POA: Diagnosis not present

## 2023-08-29 DIAGNOSIS — R531 Weakness: Secondary | ICD-10-CM | POA: Diagnosis not present

## 2023-08-29 DIAGNOSIS — R262 Difficulty in walking, not elsewhere classified: Secondary | ICD-10-CM | POA: Diagnosis not present

## 2023-08-29 DIAGNOSIS — M25662 Stiffness of left knee, not elsewhere classified: Secondary | ICD-10-CM | POA: Diagnosis not present

## 2023-08-31 DIAGNOSIS — S83242D Other tear of medial meniscus, current injury, left knee, subsequent encounter: Secondary | ICD-10-CM | POA: Diagnosis not present

## 2023-08-31 DIAGNOSIS — M25662 Stiffness of left knee, not elsewhere classified: Secondary | ICD-10-CM | POA: Diagnosis not present

## 2023-08-31 DIAGNOSIS — R531 Weakness: Secondary | ICD-10-CM | POA: Diagnosis not present

## 2023-08-31 DIAGNOSIS — R262 Difficulty in walking, not elsewhere classified: Secondary | ICD-10-CM | POA: Diagnosis not present

## 2023-09-04 DIAGNOSIS — R262 Difficulty in walking, not elsewhere classified: Secondary | ICD-10-CM | POA: Diagnosis not present

## 2023-09-04 DIAGNOSIS — R531 Weakness: Secondary | ICD-10-CM | POA: Diagnosis not present

## 2023-09-04 DIAGNOSIS — M25662 Stiffness of left knee, not elsewhere classified: Secondary | ICD-10-CM | POA: Diagnosis not present

## 2023-09-04 DIAGNOSIS — S83242D Other tear of medial meniscus, current injury, left knee, subsequent encounter: Secondary | ICD-10-CM | POA: Diagnosis not present

## 2023-09-06 DIAGNOSIS — R531 Weakness: Secondary | ICD-10-CM | POA: Diagnosis not present

## 2023-09-06 DIAGNOSIS — S83242D Other tear of medial meniscus, current injury, left knee, subsequent encounter: Secondary | ICD-10-CM | POA: Diagnosis not present

## 2023-09-06 DIAGNOSIS — R262 Difficulty in walking, not elsewhere classified: Secondary | ICD-10-CM | POA: Diagnosis not present

## 2023-09-06 DIAGNOSIS — M25662 Stiffness of left knee, not elsewhere classified: Secondary | ICD-10-CM | POA: Diagnosis not present

## 2023-09-11 DIAGNOSIS — S83242D Other tear of medial meniscus, current injury, left knee, subsequent encounter: Secondary | ICD-10-CM | POA: Diagnosis not present

## 2023-09-11 DIAGNOSIS — R262 Difficulty in walking, not elsewhere classified: Secondary | ICD-10-CM | POA: Diagnosis not present

## 2023-09-11 DIAGNOSIS — M25662 Stiffness of left knee, not elsewhere classified: Secondary | ICD-10-CM | POA: Diagnosis not present

## 2023-09-11 DIAGNOSIS — R531 Weakness: Secondary | ICD-10-CM | POA: Diagnosis not present

## 2023-09-13 DIAGNOSIS — R531 Weakness: Secondary | ICD-10-CM | POA: Diagnosis not present

## 2023-09-13 DIAGNOSIS — S83242D Other tear of medial meniscus, current injury, left knee, subsequent encounter: Secondary | ICD-10-CM | POA: Diagnosis not present

## 2023-09-13 DIAGNOSIS — M25662 Stiffness of left knee, not elsewhere classified: Secondary | ICD-10-CM | POA: Diagnosis not present

## 2023-09-13 DIAGNOSIS — R262 Difficulty in walking, not elsewhere classified: Secondary | ICD-10-CM | POA: Diagnosis not present

## 2023-09-19 DIAGNOSIS — R262 Difficulty in walking, not elsewhere classified: Secondary | ICD-10-CM | POA: Diagnosis not present

## 2023-09-19 DIAGNOSIS — S83242D Other tear of medial meniscus, current injury, left knee, subsequent encounter: Secondary | ICD-10-CM | POA: Diagnosis not present

## 2023-09-19 DIAGNOSIS — R531 Weakness: Secondary | ICD-10-CM | POA: Diagnosis not present

## 2023-09-19 DIAGNOSIS — M25662 Stiffness of left knee, not elsewhere classified: Secondary | ICD-10-CM | POA: Diagnosis not present

## 2023-09-20 DIAGNOSIS — D2272 Melanocytic nevi of left lower limb, including hip: Secondary | ICD-10-CM | POA: Diagnosis not present

## 2023-09-20 DIAGNOSIS — D2261 Melanocytic nevi of right upper limb, including shoulder: Secondary | ICD-10-CM | POA: Diagnosis not present

## 2023-09-20 DIAGNOSIS — D225 Melanocytic nevi of trunk: Secondary | ICD-10-CM | POA: Diagnosis not present

## 2023-09-20 DIAGNOSIS — R208 Other disturbances of skin sensation: Secondary | ICD-10-CM | POA: Diagnosis not present

## 2023-09-20 DIAGNOSIS — L538 Other specified erythematous conditions: Secondary | ICD-10-CM | POA: Diagnosis not present

## 2023-09-20 DIAGNOSIS — L821 Other seborrheic keratosis: Secondary | ICD-10-CM | POA: Diagnosis not present

## 2023-09-20 DIAGNOSIS — D2262 Melanocytic nevi of left upper limb, including shoulder: Secondary | ICD-10-CM | POA: Diagnosis not present

## 2023-09-20 DIAGNOSIS — D2271 Melanocytic nevi of right lower limb, including hip: Secondary | ICD-10-CM | POA: Diagnosis not present

## 2023-09-20 DIAGNOSIS — L820 Inflamed seborrheic keratosis: Secondary | ICD-10-CM | POA: Diagnosis not present

## 2023-09-21 ENCOUNTER — Other Ambulatory Visit: Payer: Self-pay | Admitting: Family Medicine

## 2023-09-21 DIAGNOSIS — Z1231 Encounter for screening mammogram for malignant neoplasm of breast: Secondary | ICD-10-CM

## 2023-09-24 DIAGNOSIS — R262 Difficulty in walking, not elsewhere classified: Secondary | ICD-10-CM | POA: Diagnosis not present

## 2023-09-24 DIAGNOSIS — R531 Weakness: Secondary | ICD-10-CM | POA: Diagnosis not present

## 2023-09-24 DIAGNOSIS — M25662 Stiffness of left knee, not elsewhere classified: Secondary | ICD-10-CM | POA: Diagnosis not present

## 2023-09-24 DIAGNOSIS — S83242D Other tear of medial meniscus, current injury, left knee, subsequent encounter: Secondary | ICD-10-CM | POA: Diagnosis not present

## 2023-09-25 DIAGNOSIS — S83282A Other tear of lateral meniscus, current injury, left knee, initial encounter: Secondary | ICD-10-CM | POA: Diagnosis not present

## 2023-11-01 ENCOUNTER — Ambulatory Visit
Admission: RE | Admit: 2023-11-01 | Discharge: 2023-11-01 | Disposition: A | Discharge status: Home / Self Care | Payer: Medicare PPO | Source: Ambulatory Visit | Attending: Family Medicine | Admitting: Family Medicine

## 2023-11-01 DIAGNOSIS — Z1231 Encounter for screening mammogram for malignant neoplasm of breast: Secondary | ICD-10-CM | POA: Diagnosis not present

## 2023-11-08 DIAGNOSIS — S83282A Other tear of lateral meniscus, current injury, left knee, initial encounter: Secondary | ICD-10-CM | POA: Diagnosis not present

## 2023-11-08 DIAGNOSIS — G8918 Other acute postprocedural pain: Secondary | ICD-10-CM | POA: Diagnosis not present

## 2023-11-08 DIAGNOSIS — S83242A Other tear of medial meniscus, current injury, left knee, initial encounter: Secondary | ICD-10-CM | POA: Diagnosis not present

## 2023-11-08 DIAGNOSIS — M1712 Unilateral primary osteoarthritis, left knee: Secondary | ICD-10-CM | POA: Diagnosis not present

## 2023-11-08 DIAGNOSIS — M2242 Chondromalacia patellae, left knee: Secondary | ICD-10-CM | POA: Diagnosis not present

## 2023-11-13 DIAGNOSIS — R531 Weakness: Secondary | ICD-10-CM | POA: Diagnosis not present

## 2023-11-13 DIAGNOSIS — M25662 Stiffness of left knee, not elsewhere classified: Secondary | ICD-10-CM | POA: Diagnosis not present

## 2023-11-13 DIAGNOSIS — R262 Difficulty in walking, not elsewhere classified: Secondary | ICD-10-CM | POA: Diagnosis not present

## 2023-11-15 DIAGNOSIS — R531 Weakness: Secondary | ICD-10-CM | POA: Diagnosis not present

## 2023-11-15 DIAGNOSIS — R262 Difficulty in walking, not elsewhere classified: Secondary | ICD-10-CM | POA: Diagnosis not present

## 2023-11-15 DIAGNOSIS — M25662 Stiffness of left knee, not elsewhere classified: Secondary | ICD-10-CM | POA: Diagnosis not present

## 2023-11-19 DIAGNOSIS — R262 Difficulty in walking, not elsewhere classified: Secondary | ICD-10-CM | POA: Diagnosis not present

## 2023-11-19 DIAGNOSIS — R531 Weakness: Secondary | ICD-10-CM | POA: Diagnosis not present

## 2023-11-19 DIAGNOSIS — M25662 Stiffness of left knee, not elsewhere classified: Secondary | ICD-10-CM | POA: Diagnosis not present

## 2023-11-22 DIAGNOSIS — M25662 Stiffness of left knee, not elsewhere classified: Secondary | ICD-10-CM | POA: Diagnosis not present

## 2023-11-22 DIAGNOSIS — R262 Difficulty in walking, not elsewhere classified: Secondary | ICD-10-CM | POA: Diagnosis not present

## 2023-11-22 DIAGNOSIS — R531 Weakness: Secondary | ICD-10-CM | POA: Diagnosis not present

## 2023-11-27 DIAGNOSIS — M25662 Stiffness of left knee, not elsewhere classified: Secondary | ICD-10-CM | POA: Diagnosis not present

## 2023-11-27 DIAGNOSIS — R262 Difficulty in walking, not elsewhere classified: Secondary | ICD-10-CM | POA: Diagnosis not present

## 2023-11-27 DIAGNOSIS — R531 Weakness: Secondary | ICD-10-CM | POA: Diagnosis not present

## 2023-11-29 DIAGNOSIS — R531 Weakness: Secondary | ICD-10-CM | POA: Diagnosis not present

## 2023-11-29 DIAGNOSIS — M25662 Stiffness of left knee, not elsewhere classified: Secondary | ICD-10-CM | POA: Diagnosis not present

## 2023-11-29 DIAGNOSIS — R262 Difficulty in walking, not elsewhere classified: Secondary | ICD-10-CM | POA: Diagnosis not present

## 2023-12-05 DIAGNOSIS — R531 Weakness: Secondary | ICD-10-CM | POA: Diagnosis not present

## 2023-12-05 DIAGNOSIS — M25662 Stiffness of left knee, not elsewhere classified: Secondary | ICD-10-CM | POA: Diagnosis not present

## 2023-12-05 DIAGNOSIS — R262 Difficulty in walking, not elsewhere classified: Secondary | ICD-10-CM | POA: Diagnosis not present

## 2023-12-06 DIAGNOSIS — R262 Difficulty in walking, not elsewhere classified: Secondary | ICD-10-CM | POA: Diagnosis not present

## 2023-12-06 DIAGNOSIS — R531 Weakness: Secondary | ICD-10-CM | POA: Diagnosis not present

## 2023-12-06 DIAGNOSIS — M25662 Stiffness of left knee, not elsewhere classified: Secondary | ICD-10-CM | POA: Diagnosis not present

## 2023-12-12 DIAGNOSIS — M25662 Stiffness of left knee, not elsewhere classified: Secondary | ICD-10-CM | POA: Diagnosis not present

## 2023-12-12 DIAGNOSIS — R531 Weakness: Secondary | ICD-10-CM | POA: Diagnosis not present

## 2023-12-12 DIAGNOSIS — R262 Difficulty in walking, not elsewhere classified: Secondary | ICD-10-CM | POA: Diagnosis not present

## 2023-12-14 DIAGNOSIS — M25662 Stiffness of left knee, not elsewhere classified: Secondary | ICD-10-CM | POA: Diagnosis not present

## 2023-12-14 DIAGNOSIS — R531 Weakness: Secondary | ICD-10-CM | POA: Diagnosis not present

## 2023-12-14 DIAGNOSIS — R262 Difficulty in walking, not elsewhere classified: Secondary | ICD-10-CM | POA: Diagnosis not present

## 2023-12-17 DIAGNOSIS — M25662 Stiffness of left knee, not elsewhere classified: Secondary | ICD-10-CM | POA: Diagnosis not present

## 2023-12-17 DIAGNOSIS — R262 Difficulty in walking, not elsewhere classified: Secondary | ICD-10-CM | POA: Diagnosis not present

## 2023-12-17 DIAGNOSIS — R531 Weakness: Secondary | ICD-10-CM | POA: Diagnosis not present

## 2023-12-19 DIAGNOSIS — R262 Difficulty in walking, not elsewhere classified: Secondary | ICD-10-CM | POA: Diagnosis not present

## 2023-12-19 DIAGNOSIS — M25662 Stiffness of left knee, not elsewhere classified: Secondary | ICD-10-CM | POA: Diagnosis not present

## 2023-12-19 DIAGNOSIS — R531 Weakness: Secondary | ICD-10-CM | POA: Diagnosis not present

## 2023-12-26 DIAGNOSIS — R262 Difficulty in walking, not elsewhere classified: Secondary | ICD-10-CM | POA: Diagnosis not present

## 2023-12-26 DIAGNOSIS — M25662 Stiffness of left knee, not elsewhere classified: Secondary | ICD-10-CM | POA: Diagnosis not present

## 2023-12-26 DIAGNOSIS — R531 Weakness: Secondary | ICD-10-CM | POA: Diagnosis not present

## 2023-12-28 DIAGNOSIS — R531 Weakness: Secondary | ICD-10-CM | POA: Diagnosis not present

## 2023-12-28 DIAGNOSIS — M25662 Stiffness of left knee, not elsewhere classified: Secondary | ICD-10-CM | POA: Diagnosis not present

## 2023-12-28 DIAGNOSIS — R262 Difficulty in walking, not elsewhere classified: Secondary | ICD-10-CM | POA: Diagnosis not present

## 2024-01-01 DIAGNOSIS — R262 Difficulty in walking, not elsewhere classified: Secondary | ICD-10-CM | POA: Diagnosis not present

## 2024-01-01 DIAGNOSIS — R531 Weakness: Secondary | ICD-10-CM | POA: Diagnosis not present

## 2024-01-01 DIAGNOSIS — M25662 Stiffness of left knee, not elsewhere classified: Secondary | ICD-10-CM | POA: Diagnosis not present

## 2024-01-03 DIAGNOSIS — M25662 Stiffness of left knee, not elsewhere classified: Secondary | ICD-10-CM | POA: Diagnosis not present

## 2024-01-03 DIAGNOSIS — R262 Difficulty in walking, not elsewhere classified: Secondary | ICD-10-CM | POA: Diagnosis not present

## 2024-01-03 DIAGNOSIS — R531 Weakness: Secondary | ICD-10-CM | POA: Diagnosis not present

## 2024-01-09 DIAGNOSIS — M25662 Stiffness of left knee, not elsewhere classified: Secondary | ICD-10-CM | POA: Diagnosis not present

## 2024-01-09 DIAGNOSIS — R531 Weakness: Secondary | ICD-10-CM | POA: Diagnosis not present

## 2024-01-09 DIAGNOSIS — R262 Difficulty in walking, not elsewhere classified: Secondary | ICD-10-CM | POA: Diagnosis not present

## 2024-01-10 DIAGNOSIS — M25662 Stiffness of left knee, not elsewhere classified: Secondary | ICD-10-CM | POA: Diagnosis not present

## 2024-01-10 DIAGNOSIS — R262 Difficulty in walking, not elsewhere classified: Secondary | ICD-10-CM | POA: Diagnosis not present

## 2024-01-10 DIAGNOSIS — R531 Weakness: Secondary | ICD-10-CM | POA: Diagnosis not present

## 2024-01-15 DIAGNOSIS — R531 Weakness: Secondary | ICD-10-CM | POA: Diagnosis not present

## 2024-01-15 DIAGNOSIS — M25662 Stiffness of left knee, not elsewhere classified: Secondary | ICD-10-CM | POA: Diagnosis not present

## 2024-01-15 DIAGNOSIS — R262 Difficulty in walking, not elsewhere classified: Secondary | ICD-10-CM | POA: Diagnosis not present

## 2024-01-17 DIAGNOSIS — M25662 Stiffness of left knee, not elsewhere classified: Secondary | ICD-10-CM | POA: Diagnosis not present

## 2024-01-17 DIAGNOSIS — R531 Weakness: Secondary | ICD-10-CM | POA: Diagnosis not present

## 2024-01-17 DIAGNOSIS — R262 Difficulty in walking, not elsewhere classified: Secondary | ICD-10-CM | POA: Diagnosis not present

## 2024-01-22 DIAGNOSIS — M25662 Stiffness of left knee, not elsewhere classified: Secondary | ICD-10-CM | POA: Diagnosis not present

## 2024-01-22 DIAGNOSIS — R262 Difficulty in walking, not elsewhere classified: Secondary | ICD-10-CM | POA: Diagnosis not present

## 2024-01-22 DIAGNOSIS — R531 Weakness: Secondary | ICD-10-CM | POA: Diagnosis not present

## 2024-01-24 DIAGNOSIS — M25662 Stiffness of left knee, not elsewhere classified: Secondary | ICD-10-CM | POA: Diagnosis not present

## 2024-01-24 DIAGNOSIS — R531 Weakness: Secondary | ICD-10-CM | POA: Diagnosis not present

## 2024-01-24 DIAGNOSIS — R262 Difficulty in walking, not elsewhere classified: Secondary | ICD-10-CM | POA: Diagnosis not present

## 2024-01-29 DIAGNOSIS — R262 Difficulty in walking, not elsewhere classified: Secondary | ICD-10-CM | POA: Diagnosis not present

## 2024-01-29 DIAGNOSIS — R531 Weakness: Secondary | ICD-10-CM | POA: Diagnosis not present

## 2024-01-29 DIAGNOSIS — M25662 Stiffness of left knee, not elsewhere classified: Secondary | ICD-10-CM | POA: Diagnosis not present

## 2024-01-31 DIAGNOSIS — R531 Weakness: Secondary | ICD-10-CM | POA: Diagnosis not present

## 2024-01-31 DIAGNOSIS — M25662 Stiffness of left knee, not elsewhere classified: Secondary | ICD-10-CM | POA: Diagnosis not present

## 2024-01-31 DIAGNOSIS — R262 Difficulty in walking, not elsewhere classified: Secondary | ICD-10-CM | POA: Diagnosis not present

## 2024-03-06 DIAGNOSIS — E7800 Pure hypercholesterolemia, unspecified: Secondary | ICD-10-CM | POA: Diagnosis not present

## 2024-03-06 DIAGNOSIS — Z79899 Other long term (current) drug therapy: Secondary | ICD-10-CM | POA: Diagnosis not present

## 2024-03-06 DIAGNOSIS — E559 Vitamin D deficiency, unspecified: Secondary | ICD-10-CM | POA: Diagnosis not present

## 2024-03-12 DIAGNOSIS — E559 Vitamin D deficiency, unspecified: Secondary | ICD-10-CM | POA: Diagnosis not present

## 2024-03-12 DIAGNOSIS — M25562 Pain in left knee: Secondary | ICD-10-CM | POA: Diagnosis not present

## 2024-03-12 DIAGNOSIS — Z0000 Encounter for general adult medical examination without abnormal findings: Secondary | ICD-10-CM | POA: Diagnosis not present

## 2024-03-12 DIAGNOSIS — E7800 Pure hypercholesterolemia, unspecified: Secondary | ICD-10-CM | POA: Diagnosis not present

## 2024-03-12 DIAGNOSIS — Z6826 Body mass index (BMI) 26.0-26.9, adult: Secondary | ICD-10-CM | POA: Diagnosis not present

## 2024-03-13 ENCOUNTER — Other Ambulatory Visit (HOSPITAL_BASED_OUTPATIENT_CLINIC_OR_DEPARTMENT_OTHER): Payer: Self-pay | Admitting: Family Medicine

## 2024-03-13 DIAGNOSIS — E7800 Pure hypercholesterolemia, unspecified: Secondary | ICD-10-CM

## 2024-03-14 ENCOUNTER — Ambulatory Visit
Admission: RE | Admit: 2024-03-14 | Discharge: 2024-03-14 | Disposition: A | Discharge status: Home / Self Care | Payer: Self-pay | Source: Ambulatory Visit | Attending: Family Medicine | Admitting: Family Medicine

## 2024-03-14 DIAGNOSIS — E7800 Pure hypercholesterolemia, unspecified: Secondary | ICD-10-CM | POA: Insufficient documentation
